# Patient Record
Sex: Male | Born: 1975 | Race: Black or African American | Hispanic: No | Marital: Single | Smoking: Never smoker
Health system: Southern US, Community
[De-identification: ages and names within clinical notes are randomized; demographics above are authoritative.]

## PROBLEM LIST (undated history)

## (undated) DIAGNOSIS — K449 Diaphragmatic hernia without obstruction or gangrene: Secondary | ICD-10-CM

## (undated) DIAGNOSIS — N419 Inflammatory disease of prostate, unspecified: Secondary | ICD-10-CM

## (undated) DIAGNOSIS — M722 Plantar fascial fibromatosis: Secondary | ICD-10-CM

## (undated) DIAGNOSIS — R000 Tachycardia, unspecified: Secondary | ICD-10-CM

## (undated) DIAGNOSIS — I10 Essential (primary) hypertension: Secondary | ICD-10-CM

## (undated) DIAGNOSIS — R002 Palpitations: Secondary | ICD-10-CM

## (undated) HISTORY — DX: Inflammatory disease of prostate, unspecified: N41.9

## (undated) HISTORY — DX: Palpitations: R00.2

## (undated) HISTORY — DX: Tachycardia, unspecified: R00.0

## (undated) HISTORY — DX: Diaphragmatic hernia without obstruction or gangrene: K44.9

## (undated) HISTORY — DX: Plantar fascial fibromatosis: M72.2

---
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
## 1997-07-10 ENCOUNTER — Emergency Department (HOSPITAL_COMMUNITY): Admission: EM | Admit: 1997-07-10 | Discharge: 1997-07-10 | Payer: Self-pay | Admitting: Emergency Medicine

## 1997-07-23 ENCOUNTER — Emergency Department (HOSPITAL_COMMUNITY): Admission: EM | Admit: 1997-07-23 | Discharge: 1997-07-24 | Payer: Self-pay | Admitting: Emergency Medicine

## 1997-11-16 ENCOUNTER — Emergency Department (HOSPITAL_COMMUNITY): Admission: EM | Admit: 1997-11-16 | Discharge: 1997-11-16 | Payer: Self-pay | Admitting: Emergency Medicine

## 1997-12-29 ENCOUNTER — Emergency Department (HOSPITAL_COMMUNITY): Admission: EM | Admit: 1997-12-29 | Discharge: 1997-12-29 | Payer: Self-pay | Admitting: Emergency Medicine

## 1998-03-13 ENCOUNTER — Emergency Department (HOSPITAL_COMMUNITY): Admission: EM | Admit: 1998-03-13 | Discharge: 1998-03-13 | Payer: Self-pay | Admitting: Emergency Medicine

## 1998-03-26 ENCOUNTER — Emergency Department (HOSPITAL_COMMUNITY): Admission: EM | Admit: 1998-03-26 | Discharge: 1998-03-26 | Payer: Self-pay | Admitting: Emergency Medicine

## 1998-04-02 ENCOUNTER — Emergency Department (HOSPITAL_COMMUNITY): Admission: EM | Admit: 1998-04-02 | Discharge: 1998-04-02 | Payer: Self-pay | Admitting: Emergency Medicine

## 1998-04-20 ENCOUNTER — Encounter: Payer: Self-pay | Admitting: Emergency Medicine

## 1998-04-20 ENCOUNTER — Emergency Department (HOSPITAL_COMMUNITY): Admission: EM | Admit: 1998-04-20 | Discharge: 1998-04-20 | Payer: Self-pay | Admitting: Emergency Medicine

## 1999-04-09 ENCOUNTER — Emergency Department (HOSPITAL_COMMUNITY): Admission: EM | Admit: 1999-04-09 | Discharge: 1999-04-09 | Payer: Self-pay | Admitting: Emergency Medicine

## 1999-09-22 ENCOUNTER — Emergency Department (HOSPITAL_COMMUNITY): Admission: EM | Admit: 1999-09-22 | Discharge: 1999-09-22 | Payer: Self-pay | Admitting: Emergency Medicine

## 1999-09-23 ENCOUNTER — Encounter: Payer: Self-pay | Admitting: Emergency Medicine

## 2000-11-27 ENCOUNTER — Emergency Department (HOSPITAL_COMMUNITY): Admission: EM | Admit: 2000-11-27 | Discharge: 2000-11-27 | Payer: Self-pay

## 2001-07-09 ENCOUNTER — Emergency Department (HOSPITAL_COMMUNITY): Admission: EM | Admit: 2001-07-09 | Discharge: 2001-07-09 | Payer: Self-pay | Admitting: Emergency Medicine

## 2001-07-09 ENCOUNTER — Encounter: Payer: Self-pay | Admitting: Emergency Medicine

## 2003-03-11 ENCOUNTER — Emergency Department (HOSPITAL_COMMUNITY): Admission: EM | Admit: 2003-03-11 | Discharge: 2003-03-11 | Payer: Self-pay | Admitting: Emergency Medicine

## 2005-03-14 ENCOUNTER — Emergency Department (HOSPITAL_COMMUNITY): Admission: EM | Admit: 2005-03-14 | Discharge: 2005-03-15 | Payer: Self-pay | Admitting: *Deleted

## 2006-01-16 ENCOUNTER — Ambulatory Visit: Payer: Self-pay | Admitting: Family Medicine

## 2006-01-16 ENCOUNTER — Emergency Department (HOSPITAL_COMMUNITY): Admission: EM | Admit: 2006-01-16 | Discharge: 2006-01-16 | Payer: Self-pay | Admitting: Emergency Medicine

## 2006-07-25 ENCOUNTER — Emergency Department (HOSPITAL_COMMUNITY): Admission: EM | Admit: 2006-07-25 | Discharge: 2006-07-25 | Payer: Self-pay | Admitting: Emergency Medicine

## 2007-04-24 ENCOUNTER — Emergency Department (HOSPITAL_COMMUNITY): Admission: EM | Admit: 2007-04-24 | Discharge: 2007-04-24 | Payer: Self-pay | Admitting: Family Medicine

## 2007-05-31 ENCOUNTER — Emergency Department (HOSPITAL_COMMUNITY): Admission: EM | Admit: 2007-05-31 | Discharge: 2007-05-31 | Payer: Self-pay | Admitting: Family Medicine

## 2007-06-30 ENCOUNTER — Emergency Department (HOSPITAL_COMMUNITY): Admission: EM | Admit: 2007-06-30 | Discharge: 2007-06-30 | Payer: Self-pay | Admitting: Emergency Medicine

## 2007-08-07 ENCOUNTER — Emergency Department (HOSPITAL_COMMUNITY): Admission: EM | Admit: 2007-08-07 | Discharge: 2007-08-07 | Payer: Self-pay | Admitting: Emergency Medicine

## 2007-08-10 ENCOUNTER — Ambulatory Visit: Payer: Self-pay | Admitting: Family Medicine

## 2007-08-14 ENCOUNTER — Ambulatory Visit: Payer: Self-pay | Admitting: Family Medicine

## 2007-08-15 ENCOUNTER — Encounter: Admission: RE | Admit: 2007-08-15 | Discharge: 2007-08-15 | Payer: Self-pay | Admitting: Family Medicine

## 2007-09-27 ENCOUNTER — Ambulatory Visit: Payer: Self-pay | Admitting: Family Medicine

## 2007-11-24 ENCOUNTER — Emergency Department (HOSPITAL_COMMUNITY): Admission: EM | Admit: 2007-11-24 | Discharge: 2007-11-24 | Payer: Self-pay | Admitting: Emergency Medicine

## 2007-12-14 ENCOUNTER — Ambulatory Visit: Payer: Self-pay | Admitting: Family Medicine

## 2008-05-24 ENCOUNTER — Emergency Department (HOSPITAL_COMMUNITY): Admission: EM | Admit: 2008-05-24 | Discharge: 2008-05-25 | Payer: Self-pay | Admitting: Emergency Medicine

## 2008-05-27 ENCOUNTER — Ambulatory Visit: Payer: Self-pay | Admitting: Family Medicine

## 2008-07-02 ENCOUNTER — Emergency Department (HOSPITAL_COMMUNITY): Admission: EM | Admit: 2008-07-02 | Discharge: 2008-07-02 | Payer: Self-pay | Admitting: Family Medicine

## 2008-09-05 ENCOUNTER — Emergency Department (HOSPITAL_COMMUNITY): Admission: EM | Admit: 2008-09-05 | Discharge: 2008-09-05 | Payer: Self-pay | Admitting: Emergency Medicine

## 2008-09-19 ENCOUNTER — Ambulatory Visit: Payer: Self-pay | Admitting: Family Medicine

## 2009-09-06 ENCOUNTER — Emergency Department (HOSPITAL_COMMUNITY): Admission: EM | Admit: 2009-09-06 | Discharge: 2009-09-06 | Payer: Self-pay | Admitting: Emergency Medicine

## 2009-09-16 ENCOUNTER — Ambulatory Visit: Payer: Self-pay | Admitting: Family Medicine

## 2009-11-17 ENCOUNTER — Ambulatory Visit: Payer: Self-pay | Admitting: Cardiovascular Disease

## 2009-11-21 ENCOUNTER — Emergency Department (HOSPITAL_COMMUNITY): Admission: EM | Admit: 2009-11-21 | Discharge: 2009-11-21 | Payer: Self-pay | Admitting: Emergency Medicine

## 2010-02-12 IMAGING — CT CT HEAD W/O CM
1 series · 16 of 28 positions shown, 20 images · non-contrast
Comparison: None.

CLINICAL DATA: Hypertension.  Weakness.  Left hand parasthesias.

CT HEAD WITHOUT CONTRAST
TECHNIQUE: Contiguous axial images were obtained from the base of
the skull through the vertex without contrast.

[Series 2: brain · axial · 0.45mm/px · z∈[+151,+281]mm · 16 of 28 slices shown, 20 images]
[im 2/28  brain]
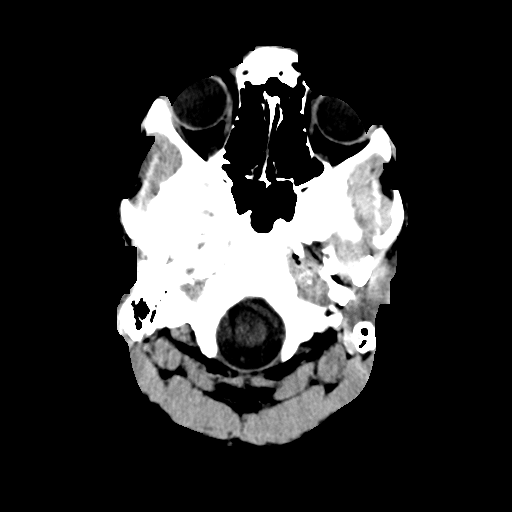
[im 2/28  bone]
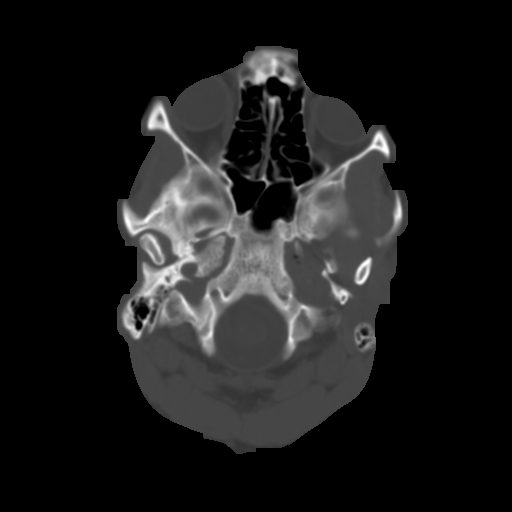
[im 4/28  brain]
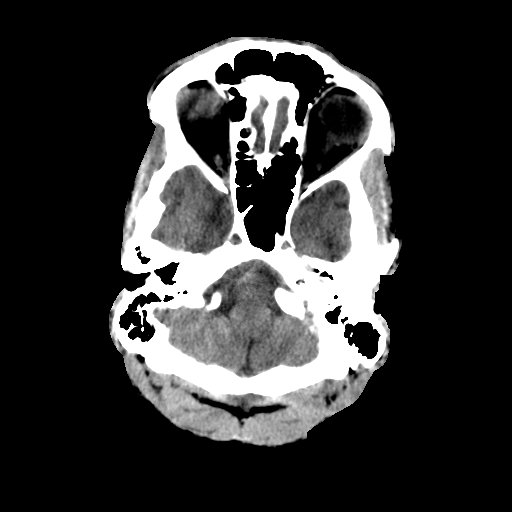
[im 6/28  brain]
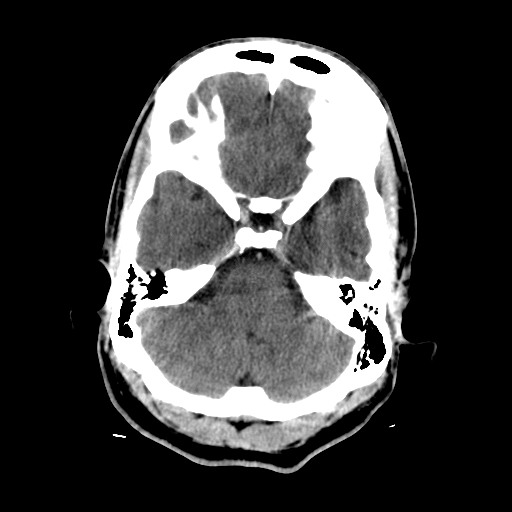
[im 7/28  brain]
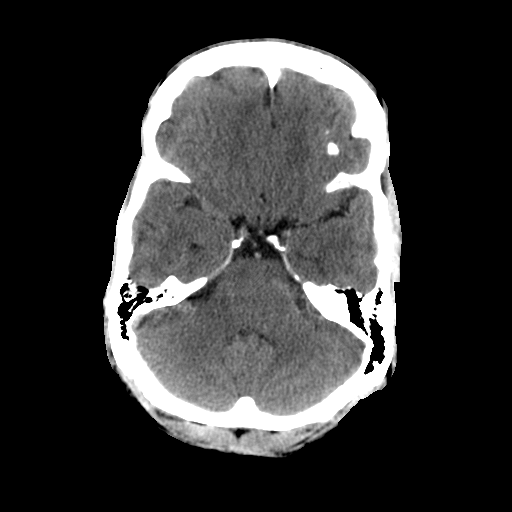
[im 9/28  brain]
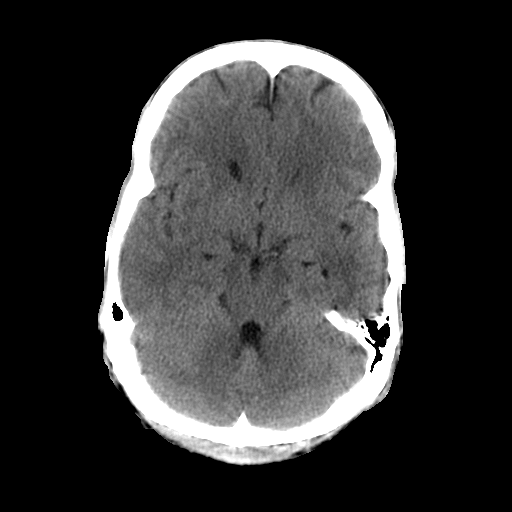
[im 9/28  bone]
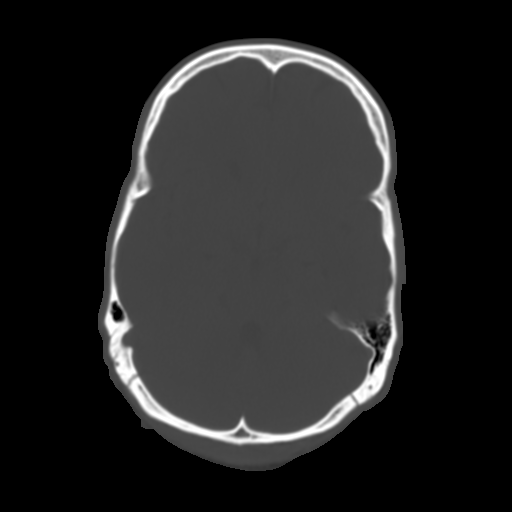
[im 10/28  brain]
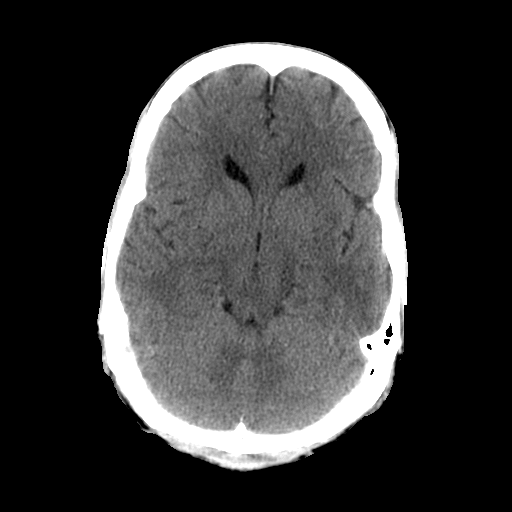
[im 12/28  brain]
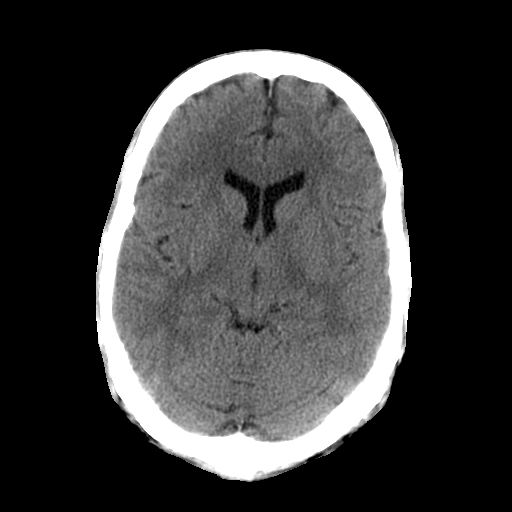
[im 14/28  brain]
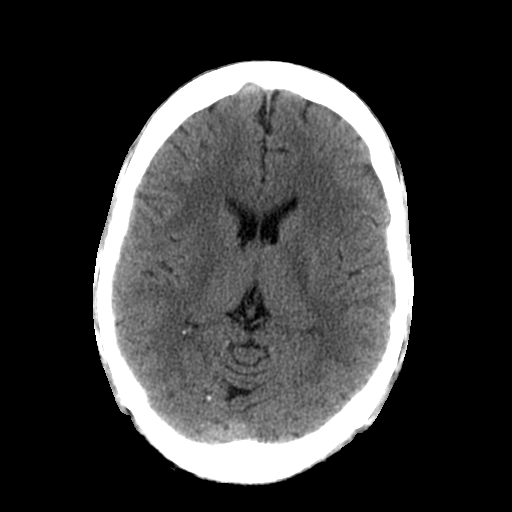
[im 15/28  brain]
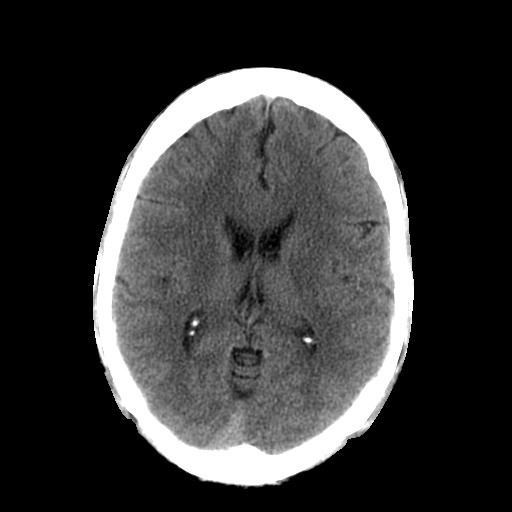
[im 15/28  bone]
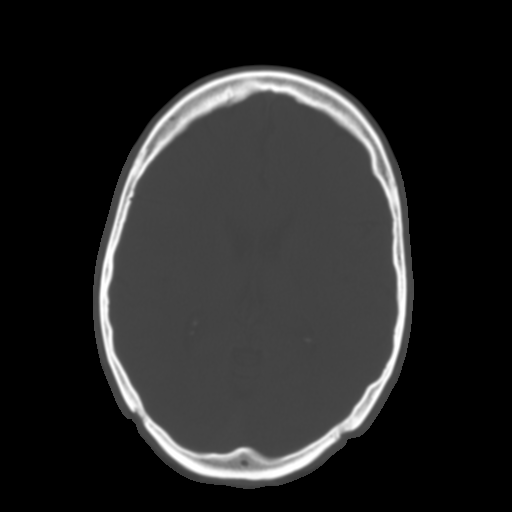
[im 17/28  brain]
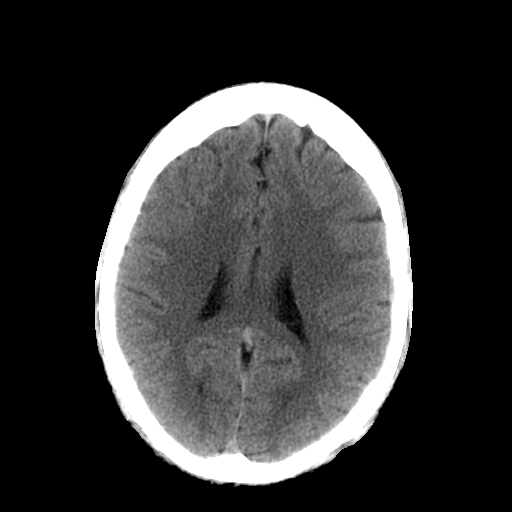
[im 19/28  brain]
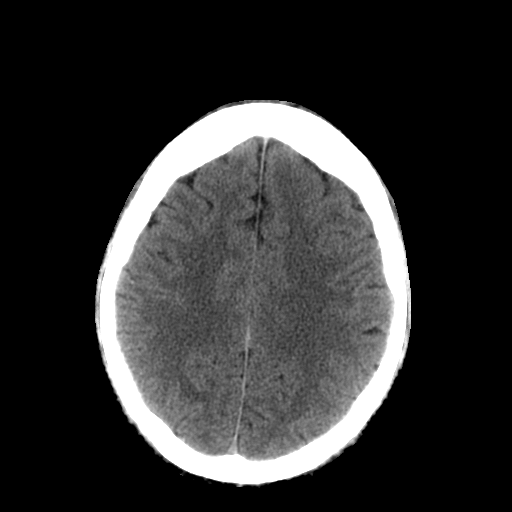
[im 20/28  brain]
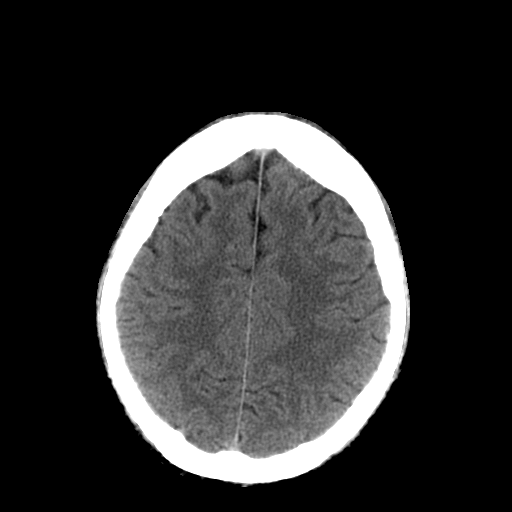
[im 22/28  brain]
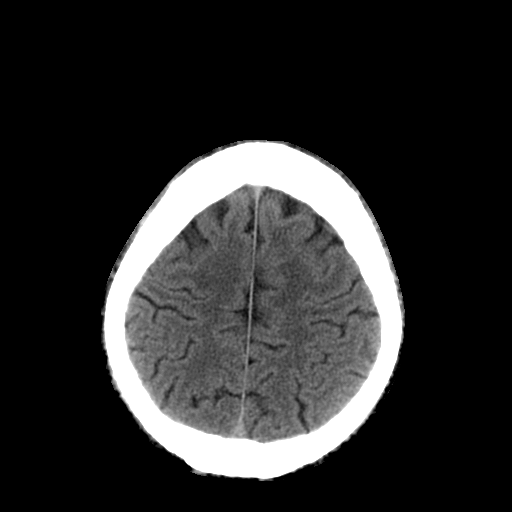
[im 22/28  bone]
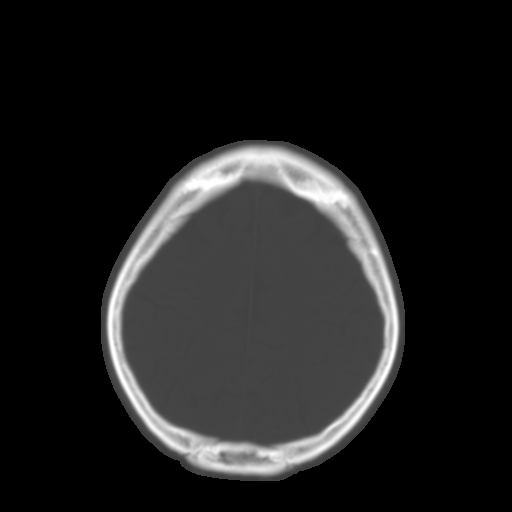
[im 23/28  brain]
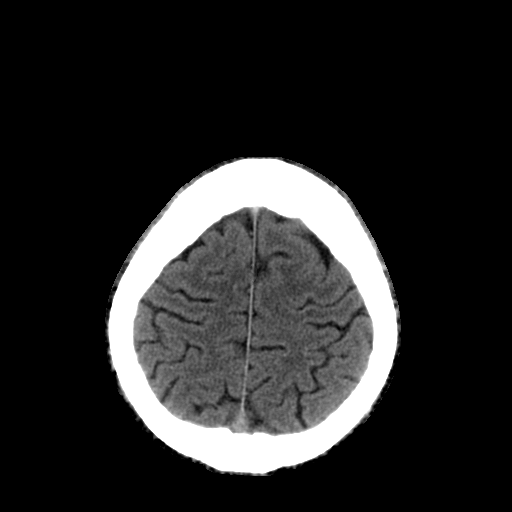
[im 25/28  brain]
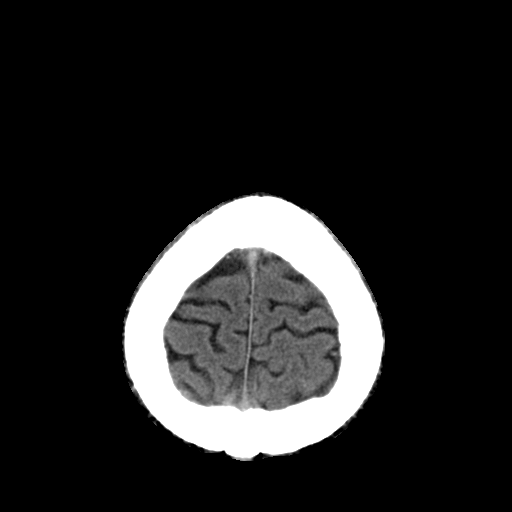
[im 27/28  brain]
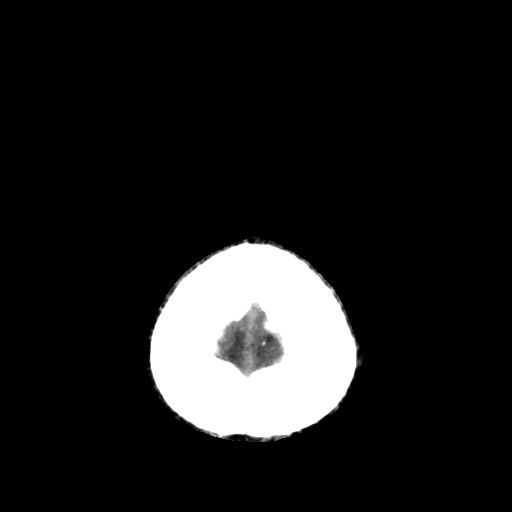

[16 of 28 positions shown; findings below may reference images not displayed]

FINDINGS: No acute intracranial abnormality is present.
Specifically, there is no evidence for acute infarct, hemorrhage,
mass, hydrocephalus, or extra-axial fluid collection.  The
paranasal sinuses and mastoid air cells are clear.  The globes and
orbits are intact.  The osseous skull is intact. Note is made of
moderate surrounding in the external auditory canals bilaterally.
IMPRESSION: 1.  No acute intracranial abnormality.

## 2010-03-27 ENCOUNTER — Emergency Department (HOSPITAL_COMMUNITY): Payer: BC Managed Care – PPO

## 2010-03-27 ENCOUNTER — Emergency Department (HOSPITAL_COMMUNITY)
Admission: EM | Admit: 2010-03-27 | Discharge: 2010-03-27 | Disposition: A | Discharge status: Home / Self Care | Payer: BC Managed Care – PPO | Attending: Emergency Medicine | Admitting: Emergency Medicine

## 2010-03-27 DIAGNOSIS — R0789 Other chest pain: Secondary | ICD-10-CM | POA: Insufficient documentation

## 2010-03-27 DIAGNOSIS — R002 Palpitations: Secondary | ICD-10-CM | POA: Insufficient documentation

## 2010-03-27 DIAGNOSIS — I10 Essential (primary) hypertension: Secondary | ICD-10-CM | POA: Insufficient documentation

## 2010-03-27 LAB — POCT I-STAT, CHEM 8
BUN: 15 mg/dL (ref 6–23)
Calcium, Ion: 1.09 mmol/L — ABNORMAL LOW (ref 1.12–1.32)
Chloride: 100 mEq/L (ref 96–112)
Creatinine, Ser: 1.2 mg/dL (ref 0.4–1.5)
Glucose, Bld: 121 mg/dL — ABNORMAL HIGH (ref 70–99)
HCT: 53 % — ABNORMAL HIGH (ref 39.0–52.0)
Hemoglobin: 18 g/dL — ABNORMAL HIGH (ref 13.0–17.0)
Potassium: 3.7 mEq/L (ref 3.5–5.1)
Sodium: 137 mEq/L (ref 135–145)
TCO2: 26 mmol/L (ref 0–100)

## 2010-03-30 IMAGING — US US ABDOMEN COMPLETE
1 series · 14 of 25 positions shown · non-contrast
Comparison: None

CLINICAL DATA: Hiatal hernia and epigastric pain

ABDOMEN ULTRASOUND
TECHNIQUE: Complete abdominal ultrasound examination was performed
including evaluation of the liver, gallbladder, bile ducts,
pancreas, kidneys, spleen, IVC, and abdominal aorta.

[Series 1: us abdomen complete · 0.26mm/px · 14 of 81 slices shown]
[im 1/81]
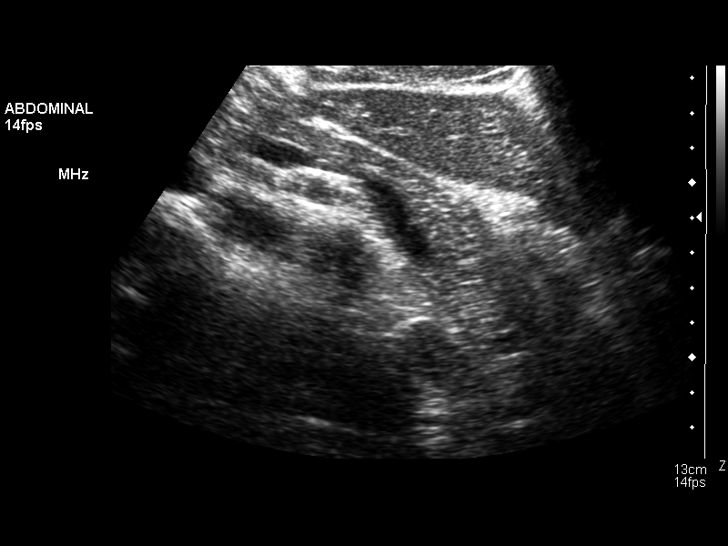
[im 7/81]
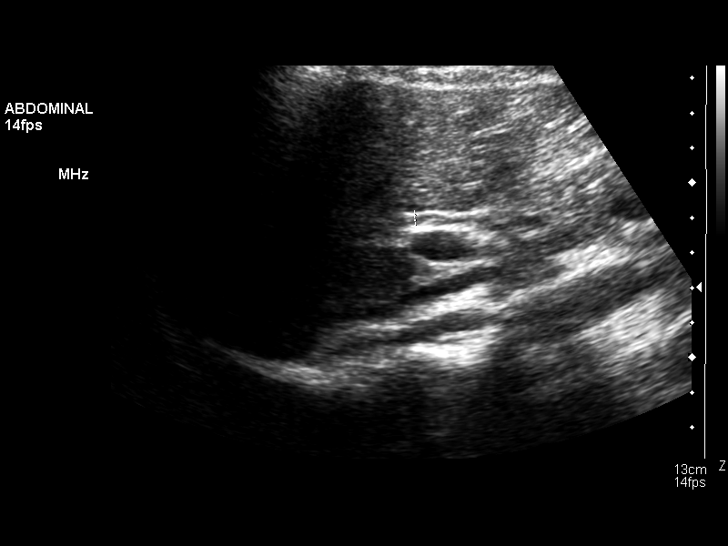
[im 14/81]
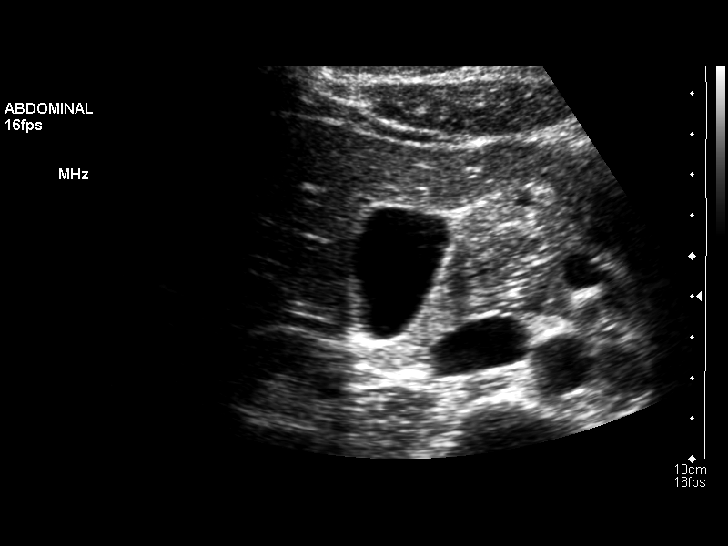
[im 21/81]
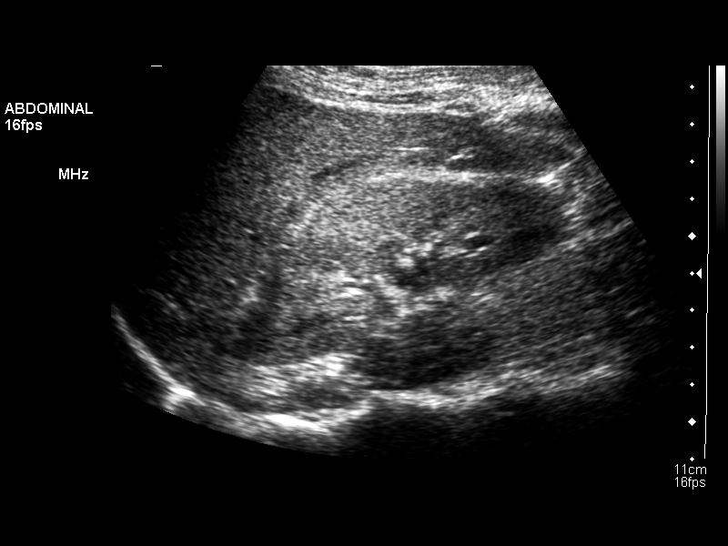
[im 27/81]
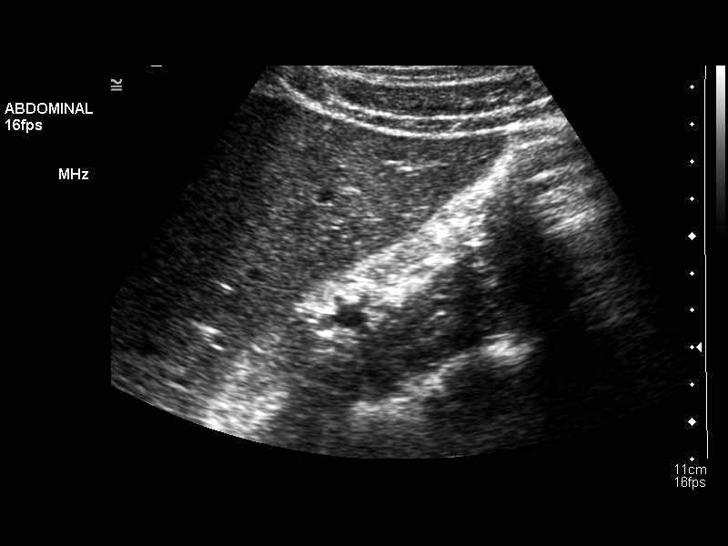
[im 31/81]
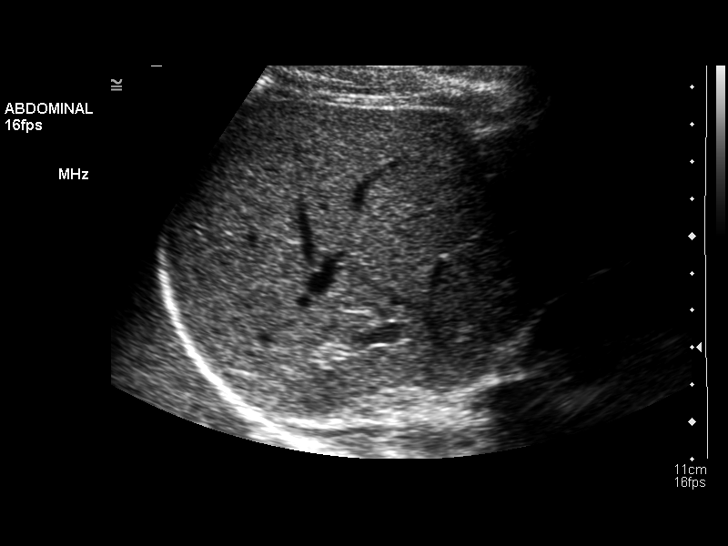
[im 37/81]
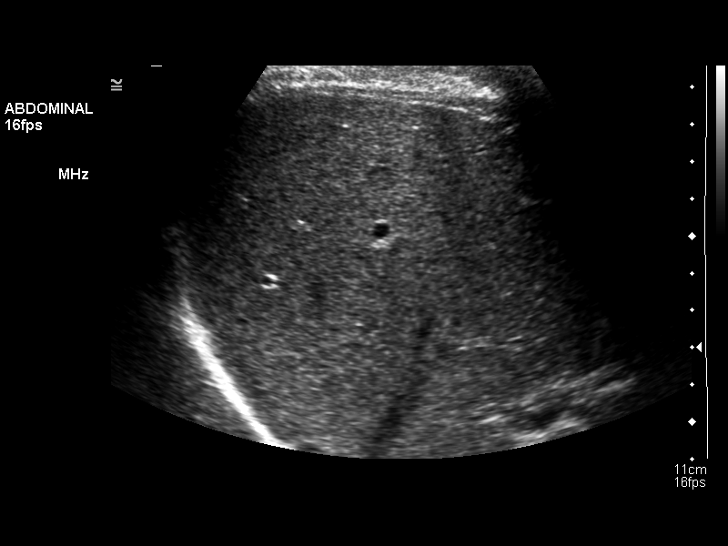
[im 44/81]
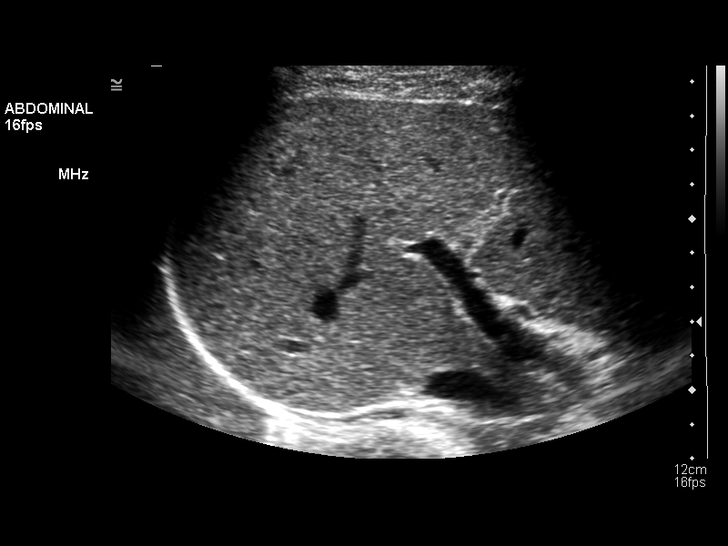
[im 51/81]
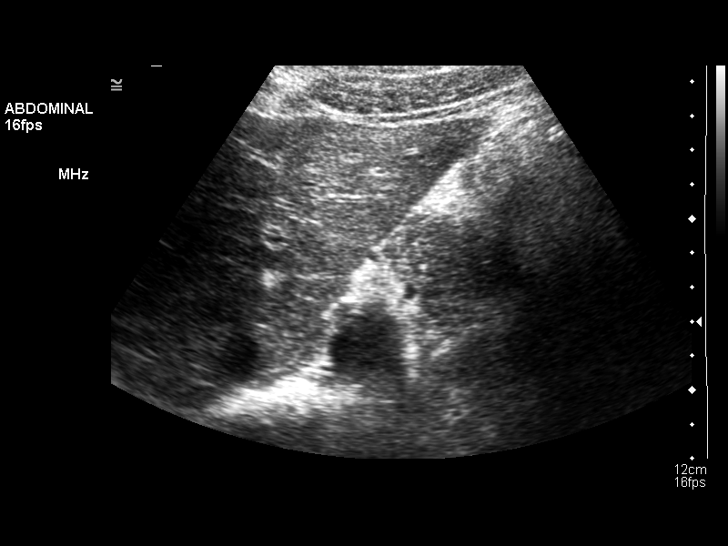
[im 54/81]
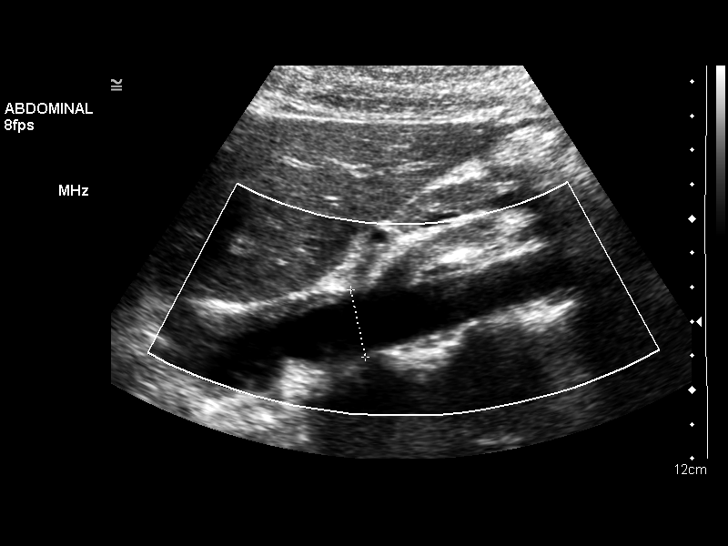
[im 61/81]
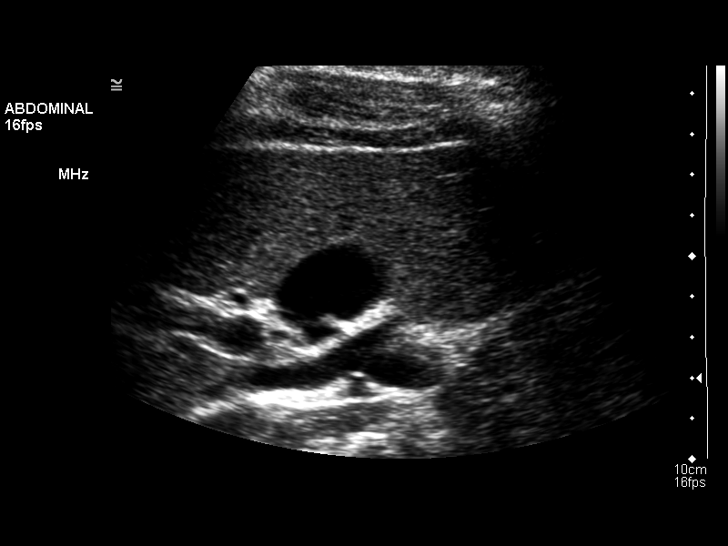
[im 67/81]
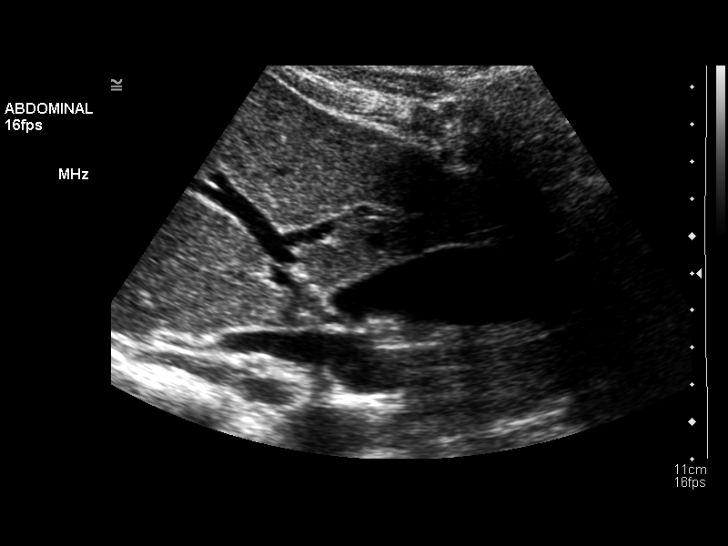
[im 74/81]
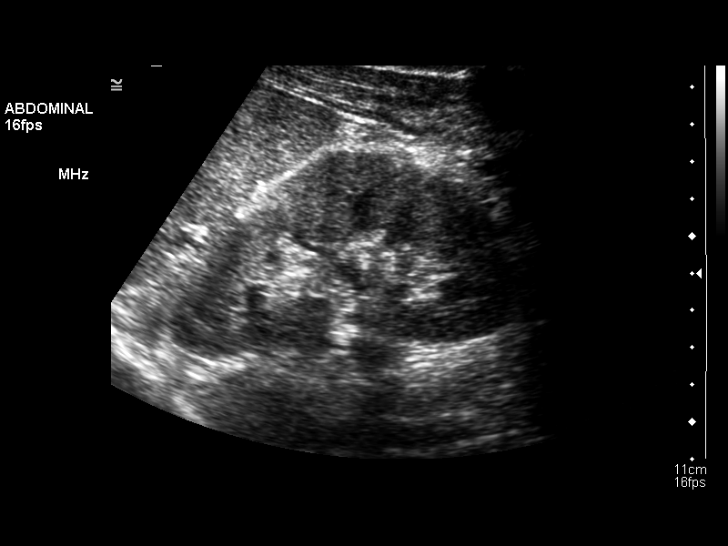
[im 81/81]
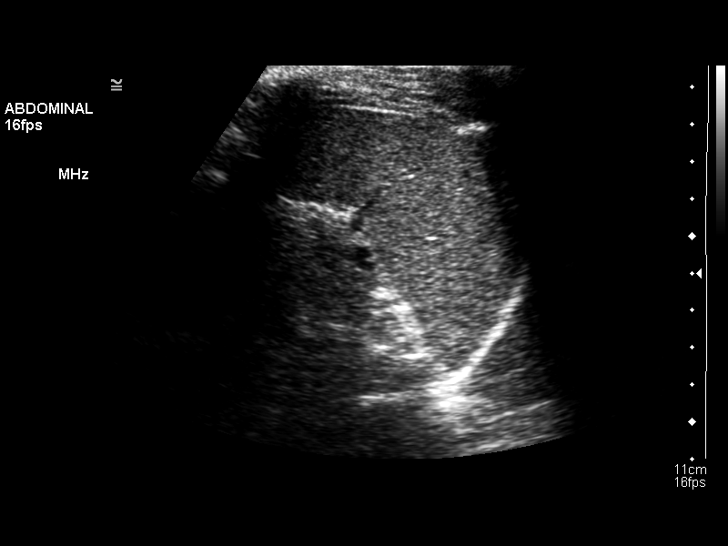

[14 of 25 positions shown; findings below may reference images not displayed]

FINDINGS: The gallbladder is normal.  No stones, wall thickening or
pericholecystic fluid.  Negative sonographic Murphy's sign.

The common bile duct is normal in caliber

The liver is negative.

IVC is patent

Pancreas negative.

Spleen is normal and measures 7.9 cm.

The right kidney is mildly echogenic but is otherwise normal in
morphology measuring 10.6 cm.

The left kidney is normal measuring 10.4 cm.

The abdominal aorta is normal in AP dimension.
IMPRESSION: 1.  No acute findings.
2.  Normal gallbladder.

## 2010-04-14 ENCOUNTER — Ambulatory Visit (INDEPENDENT_AMBULATORY_CARE_PROVIDER_SITE_OTHER): Payer: BC Managed Care – PPO | Admitting: Cardiovascular Disease

## 2010-04-14 DIAGNOSIS — I119 Hypertensive heart disease without heart failure: Secondary | ICD-10-CM

## 2010-04-19 ENCOUNTER — Ambulatory Visit (INDEPENDENT_AMBULATORY_CARE_PROVIDER_SITE_OTHER): Payer: BC Managed Care – PPO | Admitting: Family Medicine

## 2010-04-19 DIAGNOSIS — N410 Acute prostatitis: Secondary | ICD-10-CM

## 2010-04-25 ENCOUNTER — Emergency Department (HOSPITAL_COMMUNITY)
Admission: EM | Admit: 2010-04-25 | Discharge: 2010-04-25 | Disposition: A | Discharge status: Home / Self Care | Payer: BC Managed Care – PPO | Attending: Emergency Medicine | Admitting: Emergency Medicine

## 2010-04-25 DIAGNOSIS — M2550 Pain in unspecified joint: Secondary | ICD-10-CM | POA: Insufficient documentation

## 2010-04-25 DIAGNOSIS — E876 Hypokalemia: Secondary | ICD-10-CM | POA: Insufficient documentation

## 2010-04-25 DIAGNOSIS — I10 Essential (primary) hypertension: Secondary | ICD-10-CM | POA: Insufficient documentation

## 2010-04-25 DIAGNOSIS — Z79899 Other long term (current) drug therapy: Secondary | ICD-10-CM | POA: Insufficient documentation

## 2010-04-25 LAB — URINALYSIS, ROUTINE W REFLEX MICROSCOPIC
Glucose, UA: NEGATIVE mg/dL
Nitrite: NEGATIVE
Specific Gravity, Urine: 1.013 (ref 1.005–1.030)
pH: 6.5 (ref 5.0–8.0)

## 2010-04-25 LAB — POCT I-STAT, CHEM 8
BUN: 13 mg/dL (ref 6–23)
Calcium, Ion: 1.11 mmol/L — ABNORMAL LOW (ref 1.12–1.32)
Chloride: 104 mEq/L (ref 96–112)
Glucose, Bld: 132 mg/dL — ABNORMAL HIGH (ref 70–99)
HCT: 51 % (ref 39.0–52.0)
Potassium: 3.2 mEq/L — ABNORMAL LOW (ref 3.5–5.1)

## 2010-05-05 LAB — URINE CULTURE
Colony Count: NO GROWTH
Culture  Setup Time: 201110011736

## 2010-05-05 LAB — POCT I-STAT, CHEM 8
BUN: 9 mg/dL (ref 6–23)
Calcium, Ion: 1.15 mmol/L (ref 1.12–1.32)
Chloride: 97 mEq/L (ref 96–112)
Creatinine, Ser: 1.2 mg/dL (ref 0.4–1.5)
Glucose, Bld: 111 mg/dL — ABNORMAL HIGH (ref 70–99)
TCO2: 30 mmol/L (ref 0–100)

## 2010-05-05 LAB — URINALYSIS, ROUTINE W REFLEX MICROSCOPIC
Bilirubin Urine: NEGATIVE
Glucose, UA: NEGATIVE mg/dL
Hgb urine dipstick: NEGATIVE
Ketones, ur: NEGATIVE mg/dL
Protein, ur: NEGATIVE mg/dL
pH: 7 (ref 5.0–8.0)

## 2010-05-08 LAB — URINALYSIS, ROUTINE W REFLEX MICROSCOPIC
Bilirubin Urine: NEGATIVE
Glucose, UA: NEGATIVE mg/dL
Hgb urine dipstick: NEGATIVE
Ketones, ur: NEGATIVE mg/dL
pH: 6 (ref 5.0–8.0)

## 2010-05-28 ENCOUNTER — Other Ambulatory Visit: Payer: BC Managed Care – PPO | Admitting: *Deleted

## 2010-06-02 ENCOUNTER — Other Ambulatory Visit: Payer: Self-pay | Admitting: *Deleted

## 2010-06-02 DIAGNOSIS — Z79899 Other long term (current) drug therapy: Secondary | ICD-10-CM

## 2010-06-02 LAB — GLUCOSE, CAPILLARY

## 2010-06-04 ENCOUNTER — Other Ambulatory Visit (INDEPENDENT_AMBULATORY_CARE_PROVIDER_SITE_OTHER): Payer: BC Managed Care – PPO | Admitting: *Deleted

## 2010-06-04 DIAGNOSIS — Z79899 Other long term (current) drug therapy: Secondary | ICD-10-CM

## 2010-06-04 LAB — HEPATIC FUNCTION PANEL
AST: 37 U/L (ref 0–37)
Alkaline Phosphatase: 86 U/L (ref 39–117)
Bilirubin, Direct: 0.1 mg/dL (ref 0.0–0.3)
Total Protein: 7.1 g/dL (ref 6.0–8.3)

## 2010-06-04 LAB — BASIC METABOLIC PANEL
CO2: 31 mEq/L (ref 19–32)
Calcium: 10.1 mg/dL (ref 8.4–10.5)
Potassium: 4.2 mEq/L (ref 3.5–5.1)
Sodium: 138 mEq/L (ref 135–145)

## 2010-06-07 ENCOUNTER — Telehealth: Payer: Self-pay | Admitting: *Deleted

## 2010-06-07 NOTE — Telephone Encounter (Signed)
Patient called with lab results. Pt verbalized understanding. Jodette Shalom Ware RN  

## 2010-06-24 ENCOUNTER — Telehealth: Payer: Self-pay | Admitting: *Deleted

## 2010-06-24 NOTE — Telephone Encounter (Signed)
Pt calling wanting to know if it is ok to take an OTC prostate supplement.  L/M for pt to contact PCP regarding the supplement or he can call his Urologist.

## 2010-08-14 ENCOUNTER — Emergency Department (HOSPITAL_COMMUNITY)
Admission: EM | Admit: 2010-08-14 | Discharge: 2010-08-14 | Disposition: A | Discharge status: Home / Self Care | Payer: BC Managed Care – PPO | Attending: Emergency Medicine | Admitting: Emergency Medicine

## 2010-08-14 ENCOUNTER — Other Ambulatory Visit: Payer: Self-pay | Admitting: Cardiovascular Disease

## 2010-08-14 DIAGNOSIS — Z760 Encounter for issue of repeat prescription: Secondary | ICD-10-CM | POA: Insufficient documentation

## 2010-08-14 DIAGNOSIS — I10 Essential (primary) hypertension: Secondary | ICD-10-CM | POA: Insufficient documentation

## 2010-08-14 DIAGNOSIS — Z79899 Other long term (current) drug therapy: Secondary | ICD-10-CM | POA: Insufficient documentation

## 2010-08-16 ENCOUNTER — Other Ambulatory Visit: Payer: Self-pay | Admitting: *Deleted

## 2010-08-16 MED ORDER — METOPROLOL TARTRATE 50 MG PO TABS: 50.0000 mg | ORAL_TABLET | Freq: Two times a day (BID) | ORAL | Status: DC

## 2010-08-16 NOTE — Telephone Encounter (Signed)
Fax received from pharmacy. Refill completed. Jodette Henretta Quist RN  

## 2010-08-19 ENCOUNTER — Telehealth: Payer: Self-pay | Admitting: Cardiovascular Disease

## 2010-08-19 NOTE — Telephone Encounter (Signed)
Was refilled on 08/16/10 pt informed.

## 2010-08-19 NOTE — Telephone Encounter (Signed)
Wants a refill on his metoprolol sent to [facility]

## 2010-09-30 ENCOUNTER — Encounter: Payer: Self-pay | Admitting: Family Medicine

## 2010-11-16 LAB — POCT URINALYSIS DIP (DEVICE)
Bilirubin Urine: NEGATIVE
Hgb urine dipstick: NEGATIVE
Ketones, ur: NEGATIVE
pH: 7.5

## 2010-11-18 LAB — POCT URINALYSIS DIP (DEVICE)
Bilirubin Urine: NEGATIVE
Hgb urine dipstick: NEGATIVE
Ketones, ur: NEGATIVE
pH: 7

## 2010-11-18 LAB — POCT I-STAT, CHEM 8
BUN: 12
Calcium, Ion: 1.17
Chloride: 100
Creatinine, Ser: 1.1
Glucose, Bld: 102 — ABNORMAL HIGH

## 2010-11-22 LAB — URINE CULTURE
Colony Count: NO GROWTH
Culture: NO GROWTH

## 2010-11-22 LAB — POCT URINALYSIS DIP (DEVICE)
Glucose, UA: NEGATIVE
Hgb urine dipstick: NEGATIVE
Nitrite: NEGATIVE
Operator id: 247071
Urobilinogen, UA: 0.2

## 2010-11-22 LAB — GLUCOSE, CAPILLARY: Glucose-Capillary: 79

## 2010-11-27 ENCOUNTER — Emergency Department (HOSPITAL_COMMUNITY)
Admission: EM | Admit: 2010-11-27 | Discharge: 2010-11-27 | Disposition: A | Discharge status: Home / Self Care | Payer: BC Managed Care – PPO | Attending: Emergency Medicine | Admitting: Emergency Medicine

## 2010-11-27 DIAGNOSIS — I10 Essential (primary) hypertension: Secondary | ICD-10-CM | POA: Insufficient documentation

## 2010-11-27 DIAGNOSIS — M25549 Pain in joints of unspecified hand: Secondary | ICD-10-CM | POA: Insufficient documentation

## 2010-11-27 DIAGNOSIS — E876 Hypokalemia: Secondary | ICD-10-CM | POA: Insufficient documentation

## 2010-11-27 DIAGNOSIS — E860 Dehydration: Secondary | ICD-10-CM | POA: Insufficient documentation

## 2010-11-27 LAB — URINALYSIS, ROUTINE W REFLEX MICROSCOPIC
Hgb urine dipstick: NEGATIVE
Ketones, ur: 15 mg/dL — AB
Protein, ur: NEGATIVE mg/dL
Urobilinogen, UA: 0.2 mg/dL (ref 0.0–1.0)

## 2010-11-27 LAB — POCT I-STAT, CHEM 8
Creatinine, Ser: 1.2 mg/dL (ref 0.50–1.35)
Hemoglobin: 17.3 g/dL — ABNORMAL HIGH (ref 13.0–17.0)
Sodium: 139 mEq/L (ref 135–145)
TCO2: 25 mmol/L (ref 0–100)

## 2010-11-28 LAB — URINE CULTURE

## 2010-11-29 ENCOUNTER — Other Ambulatory Visit: Payer: Self-pay | Admitting: Cardiovascular Disease

## 2010-12-03 ENCOUNTER — Encounter: Payer: Self-pay | Admitting: Family Medicine

## 2010-12-03 ENCOUNTER — Ambulatory Visit (INDEPENDENT_AMBULATORY_CARE_PROVIDER_SITE_OTHER): Payer: BC Managed Care – PPO | Admitting: Family Medicine

## 2010-12-03 VITALS — BP 140/88 | HR 93 | Wt 182.0 lb

## 2010-12-03 DIAGNOSIS — N419 Inflammatory disease of prostate, unspecified: Secondary | ICD-10-CM

## 2010-12-03 DIAGNOSIS — E876 Hypokalemia: Secondary | ICD-10-CM

## 2010-12-03 DIAGNOSIS — I10 Essential (primary) hypertension: Secondary | ICD-10-CM

## 2010-12-03 LAB — ELECTROLYTE PANEL
Chloride: 102 mEq/L (ref 96–112)
Potassium: 4.3 mEq/L (ref 3.5–5.3)
Sodium: 138 mEq/L (ref 135–145)

## 2010-12-03 MED ORDER — CIPROFLOXACIN HCL 500 MG PO TABS: 500.0000 mg | ORAL_TABLET | Freq: Two times a day (BID) | ORAL | Status: AC

## 2010-12-03 NOTE — Progress Notes (Signed)
  Subjective:    Patient ID: Christopher Allison, male    DOB: 01-16-1976, 35 y.o.   MRN: [id number]  HPI Approximately 5 days ago he started having difficulty with polyuria then developed some back and perineal pain. He did go to the emergency room on Saturday for symptoms of stiffness in his joints prior to developing the urinary symptoms but did have his urine checked and it was apparently negative. He has a previous history of this which turned out to be hypokalemic. His potassium was 3.1 and they gave him potassium supplementation.    Review of Systems     Objective:   Physical Exam Alert and in no distress. Rectal exam does show a tender boggy prostate.       Assessment & Plan:   1. Hypertension    2. Hypokalemia  Electrolyte panel  3. Prostatitis     I will place him on Cipro for the next month. He is also to continue on potassium supplementation.

## 2010-12-03 NOTE — Patient Instructions (Signed)
Take all the antibiotic and if not back to normal, let me know. You can also try No Salt that you can get at the grocery store continue on the potassium supplement pills.

## 2010-12-17 ENCOUNTER — Other Ambulatory Visit: Payer: Self-pay | Admitting: Cardiovascular Disease

## 2010-12-22 ENCOUNTER — Telehealth: Payer: Self-pay | Admitting: Cardiovascular Disease

## 2010-12-22 NOTE — Telephone Encounter (Signed)
New message  Pt said he took rx to pharmacy on Friday  Pharmacy [facility] [address]  He wants refill of hydrochlorothiazide  Please call when done

## 2011-02-20 ENCOUNTER — Other Ambulatory Visit: Payer: Self-pay | Admitting: Cardiovascular Disease

## 2011-02-25 ENCOUNTER — Other Ambulatory Visit: Payer: Self-pay | Admitting: Cardiovascular Disease

## 2011-02-25 MED ORDER — METOPROLOL TARTRATE 50 MG PO TABS: 50.0000 mg | ORAL_TABLET | Freq: Two times a day (BID) | ORAL | Status: DC

## 2011-02-25 NOTE — Telephone Encounter (Signed)
Has a recall sent app due 2/13

## 2011-02-25 NOTE — Telephone Encounter (Signed)
Pt needs appointment then refill can be made, Fax Received. Refill Completed. Christopher Allison (R.M.A)

## 2011-04-25 ENCOUNTER — Telehealth: Payer: Self-pay | Admitting: Internal Medicine

## 2011-04-26 NOTE — Telephone Encounter (Signed)
He complains of difficulty with decreased urinary stream. He also set some back pain but relates this more of physical activity. I recommended he come in for an evaluation.

## 2011-05-05 ENCOUNTER — Encounter (HOSPITAL_COMMUNITY): Payer: Self-pay | Admitting: *Deleted

## 2011-05-05 ENCOUNTER — Emergency Department (HOSPITAL_COMMUNITY)
Admission: EM | Admit: 2011-05-05 | Discharge: 2011-05-05 | Disposition: A | Discharge status: Home / Self Care | Payer: BC Managed Care – PPO | Attending: Emergency Medicine | Admitting: Emergency Medicine

## 2011-05-05 DIAGNOSIS — R000 Tachycardia, unspecified: Secondary | ICD-10-CM | POA: Insufficient documentation

## 2011-05-05 DIAGNOSIS — M549 Dorsalgia, unspecified: Secondary | ICD-10-CM

## 2011-05-05 DIAGNOSIS — I10 Essential (primary) hypertension: Secondary | ICD-10-CM | POA: Insufficient documentation

## 2011-05-05 DIAGNOSIS — M545 Low back pain, unspecified: Secondary | ICD-10-CM | POA: Insufficient documentation

## 2011-05-05 DIAGNOSIS — K921 Melena: Secondary | ICD-10-CM | POA: Insufficient documentation

## 2011-05-05 HISTORY — DX: Essential (primary) hypertension: I10

## 2011-05-05 LAB — URINALYSIS, ROUTINE W REFLEX MICROSCOPIC
Glucose, UA: NEGATIVE mg/dL
Ketones, ur: NEGATIVE mg/dL
Leukocytes, UA: NEGATIVE
Specific Gravity, Urine: 1.006 (ref 1.005–1.030)
pH: 7 (ref 5.0–8.0)

## 2011-05-05 LAB — POCT I-STAT, CHEM 8
BUN: 15 mg/dL (ref 6–23)
Calcium, Ion: 1.24 mmol/L (ref 1.12–1.32)
Chloride: 101 mEq/L (ref 96–112)

## 2011-05-05 LAB — OCCULT BLOOD, POC DEVICE: Fecal Occult Bld: NEGATIVE

## 2011-05-05 MED ORDER — ACETAMINOPHEN 325 MG PO TABS
975.0000 mg | ORAL_TABLET | Freq: Once | ORAL | Status: AC
  Administered 2011-05-05: 975 mg via ORAL
  Filled 2011-05-05: qty 3

## 2011-05-05 NOTE — ED Provider Notes (Signed)
History     CSN: [id number]  Arrival date & time 05/05/11  1548   First MD Initiated Contact with Patient 05/05/11 1956      Chief Complaint  Patient presents with  . Back Pain    (Consider location/radiation/quality/duration/timing/severity/associated sxs/prior treatment) HPI Complains of low back pain onset 3 days ago after lifting heavy barrels pain is nonradiating mild at present. Dull in quality worse with moving improved with remaining still. No treatment prior to coming here. No other associated symptoms She has also noticed blood on toilet paper for one week when wiping. Healso reports difficulty with urinary stream for 3 years. Unchanged. States dysuria is secondary to "prostate problems" Past Medical History  Diagnosis Date  . HH (hiatus hernia)   . Tachycardia   . Hypertension     History reviewed. No pertinent past surgical history. Hernia repair History reviewed. No pertinent family history.  History  Substance Use Topics  . Smoking status: Never Smoker   . Smokeless tobacco: Never Used  . Alcohol Use: No      Review of Systems  Constitutional: Negative.   HENT: Negative.   Respiratory: Negative.   Cardiovascular: Negative.   Gastrointestinal: Positive for blood in stool.  Genitourinary: Positive for difficulty urinating.  Musculoskeletal: Positive for back pain.  Skin: Negative.   Neurological: Negative.   Hematological: Negative.   Psychiatric/Behavioral: Negative.   All other systems reviewed and are negative.    Allergies  Toprol xl  Home Medications   Current Outpatient Rx  Name Route Sig Dispense Refill  . ACETAMINOPHEN 500 MG PO TABS Oral Take 500 mg by mouth every 6 (six) hours as needed. For pain    . HYDROCHLOROTHIAZIDE 25 MG PO TABS Oral Take 25 mg by mouth daily.    Marland Kitchen METOPROLOL TARTRATE 50 MG PO TABS Oral Take 1 tablet (50 mg total) by mouth 2 (two) times daily. 60 tablet 0    Pt needs appointment then refill can be made  .  POTASSIUM CHLORIDE CRYS ER 10 MEQ PO TBCR Oral Take 10 mEq by mouth daily.      BP 127/75  Pulse 87  Temp(Src) 97.9 F (36.6 C) (Oral)  Resp 16  SpO2 100%  Physical Exam  Constitutional: He is oriented to person, place, and time. He appears well-developed and well-nourished.  HENT:  Head: Normocephalic and atraumatic.  Eyes: Conjunctivae are normal. Pupils are equal, round, and reactive to light.  Neck: Neck supple. No tracheal deviation present. No thyromegaly present.  Cardiovascular: Normal rate and regular rhythm.   No murmur heard. Pulmonary/Chest: Effort normal and breath sounds normal.  Abdominal: Soft. Bowel sounds are normal. He exhibits no distension. There is no tenderness.  Genitourinary: Rectum normal. Guaiac negative stool.  Musculoskeletal: Normal range of motion. He exhibits no edema and no tenderness.       Entire spine nontender  Neurological: He is alert and oriented to person, place, and time. He displays normal reflexes. He exhibits normal muscle tone. Coordination normal.  Skin: Skin is warm and dry. No rash noted.  Psychiatric: He has a normal mood and affect.    ED Course  Procedures (including critical care time)  Labs Reviewed  POCT I-STAT, CHEM 8 - Abnormal; Notable for the following:    Hemoglobin 17.7 (*)    All other components within normal limits  URINALYSIS, ROUTINE W REFLEX MICROSCOPIC   No results found. Results for orders placed during the hospital encounter of 05/05/11  POCT I-STAT,  CHEM 8      Component Value Range   Sodium 140  135 - 145 (mEq/L)   Potassium 3.8  3.5 - 5.1 (mEq/L)   Chloride 101  96 - 112 (mEq/L)   BUN 15  6 - 23 (mg/dL)   Creatinine, Ser 4.54  0.50 - 1.35 (mg/dL)   Glucose, Bld 96  70 - 99 (mg/dL)   Calcium, Ion 0.98  1.19 - 1.32 (mmol/L)   TCO2 28  0 - 100 (mmol/L)   Hemoglobin 17.7 (*) 13.0 - 17.0 (g/dL)   HCT 14.7  82.9 - 56.2 (%)  URINALYSIS, ROUTINE W REFLEX MICROSCOPIC      Component Value Range    Color, Urine YELLOW  YELLOW    APPearance CLEAR  CLEAR    Specific Gravity, Urine 1.006  1.005 - 1.030    pH 7.0  5.0 - 8.0    Glucose, UA NEGATIVE  NEGATIVE (mg/dL)   Hgb urine dipstick NEGATIVE  NEGATIVE    Bilirubin Urine NEGATIVE  NEGATIVE    Ketones, ur NEGATIVE  NEGATIVE (mg/dL)   Protein, ur NEGATIVE  NEGATIVE (mg/dL)   Urobilinogen, UA 0.2  0.0 - 1.0 (mg/dL)   Nitrite NEGATIVE  NEGATIVE    Leukocytes, UA NEGATIVE  NEGATIVE    No results found.   No diagnosis found.  Client pain medicine or prescription for pain medicine once Tylenol only. Tylenol ordered by me  MDM  Back pain felt to be musculoskeletal in etiology. Patient has been followed for "prostate problems He is advised to follow up with Dr.Lalonde for rectal bleeding. May need further diagnostic testing Diagnosis low back pain        Doug Sou, MD 05/05/11 2010

## 2011-05-05 NOTE — ED Notes (Addendum)
Pt reports for the last two weeks he has had lower back pain, reports trouble urinating, pt states he also has noticed blood on the tissue after bowel movement. Reports he only has the lower back pain when he sits down, no pain when standing.

## 2011-05-05 NOTE — Discharge Instructions (Signed)
Back Pain, Adult Back pain is very common. The pain often gets better over time. The cause of back pain is usually not dangerous. Most people can learn to manage their back pain on their own.  HOME CARE   Stay active. Start with short walks on flat ground if you can. Try to walk farther each day.   Do not sit, drive, or stand in one place for more than 30 minutes. Do not stay in bed.   Do not avoid exercise or work. Activity can help your back heal faster.   Be careful when you bend or lift an object. Bend at your knees, keep the object close to you, and do not twist.   Sleep on a firm mattress. Lie on your side, and bend your knees. If you lie on your back, put a pillow under your knees.   Only take medicines as told by your doctor.   Put ice on the injured area.   Put ice in a plastic bag.   Place a towel between your skin and the bag.   Leave the ice on for 15 to 20 minutes, 3 to 4 times a day for the first 2 to 3 days. After that, you can switch between ice and heat packs.   Ask your doctor about back exercises or massage.   Avoid feeling anxious or stressed. Find good ways to deal with stress, such as exercise.  GET HELP RIGHT AWAY IF:   Your pain does not go away with rest or medicine.   Your pain does not go away in 1 week.   You have new problems.   You do not feel well.   The pain spreads into your legs.   You cannot control when you poop (bowel movement) or pee (urinate).   Your arms or legs feel weak or lose feeling (numbness).   You feel sick to your stomach (nauseous) or throw up (vomit).   You have belly (abdominal) pain.   You feel like you may pass out (faint).  MAKE SURE YOU:   Understand these instructions.   Will watch your condition.   Will get help right away if you are not doing well or get worse.  Document Released: 07/27/2007 Document Revised: 01/27/2011 Document Reviewed: 06/28/2010 [facility] Patient Information 2012 [place],  [place].   Take Tylenol as needed for pain. Call Dr.Lalonde tomorrow for followup regarding rectal bleeding. You may need further testing as an outpatient

## 2011-05-11 ENCOUNTER — Encounter: Payer: Self-pay | Admitting: *Deleted

## 2011-07-01 ENCOUNTER — Encounter: Payer: Self-pay | Admitting: Family Medicine

## 2011-07-01 ENCOUNTER — Ambulatory Visit (INDEPENDENT_AMBULATORY_CARE_PROVIDER_SITE_OTHER): Payer: BC Managed Care – PPO | Admitting: Family Medicine

## 2011-07-01 ENCOUNTER — Other Ambulatory Visit: Payer: Self-pay | Admitting: Cardiovascular Disease

## 2011-07-01 VITALS — BP 150/90 | HR 93 | Ht 68.0 in | Wt 149.0 lb

## 2011-07-01 DIAGNOSIS — K602 Anal fissure, unspecified: Secondary | ICD-10-CM

## 2011-07-01 NOTE — Progress Notes (Signed)
  Subjective:    Patient ID: Christopher Allison, male    DOB: 19-Mar-1975, 36 y.o.   MRN: [id number]  HPI He has had intermittent difficulty with rectal discomfort and noting Christopher Allison red blood on the toilet paper after he has had a BM. He has noted no stinging with BMs. Has had no abdominal pain, constipation or diarrhea.   Review of Systems     Objective:   Physical Exam Exam of the rectal area shows no external hemorrhoids. Questionable fissure noted at 12:00. Digital exam did show tenderness at 12:00.       Assessment & Plan:   1. Rectal fissure    I explained rectal fissure to him recommend fluids, bulk in diet and exercise. He will return here if continued difficulty.

## 2011-07-01 NOTE — Patient Instructions (Signed)
Insures softer bowel movements with fluids, bulk in diet and exercise

## 2011-08-03 ENCOUNTER — Other Ambulatory Visit: Payer: Self-pay | Admitting: *Deleted

## 2011-08-03 MED ORDER — METOPROLOL TARTRATE 50 MG PO TABS: 50.0000 mg | ORAL_TABLET | Freq: Two times a day (BID) | ORAL | Status: DC

## 2011-08-03 MED ORDER — POTASSIUM CHLORIDE ER 10 MEQ PO TBCR: 10.0000 meq | EXTENDED_RELEASE_TABLET | Freq: Every day | ORAL | Status: DC

## 2011-08-03 MED ORDER — HYDROCHLOROTHIAZIDE 25 MG PO TABS: 25.0000 mg | ORAL_TABLET | Freq: Every day | ORAL | Status: DC

## 2011-08-03 NOTE — Telephone Encounter (Signed)
Pt needs appointment then refill can be made Fax Received. Refill Completed. [name] (R.M.A)   

## 2011-09-19 ENCOUNTER — Ambulatory Visit (INDEPENDENT_AMBULATORY_CARE_PROVIDER_SITE_OTHER): Payer: BC Managed Care – PPO | Admitting: Cardiovascular Disease

## 2011-09-19 ENCOUNTER — Encounter: Payer: Self-pay | Admitting: Cardiovascular Disease

## 2011-09-19 VITALS — BP 140/90 | HR 91 | Ht 68.0 in | Wt 199.8 lb

## 2011-09-19 DIAGNOSIS — I10 Essential (primary) hypertension: Secondary | ICD-10-CM

## 2011-09-19 NOTE — Progress Notes (Signed)
    [name] Date of Birth  12/20/1975       [facility] Office [address]  [address] [place], [place]  [address]   [place], [place]  [address] [phone]     [phone]   Fax  [phone]    Fax [phone]  Problem List: 1. Tachycardia 2. Hypertension  History of Present Illness:  Allon is a 36 yo with HTN.  He's done well since I last saw him a year ago. He's not exercising as much as he would like and has gained some weight. He also has been eating some salty foods. He denies any episodes of chest pain or shortness of breath.  Current Outpatient Prescriptions on File Prior to Visit  Medication Sig Dispense Refill  . acetaminophen (TYLENOL) 500 MG tablet Take 500 mg by mouth every 6 (six) hours as needed. For pain      . hydrochlorothiazide (HYDRODIURIL) 25 MG tablet Take 1 tablet (25 mg total) by mouth daily.  30 tablet  1  . metoprolol (LOPRESSOR) 50 MG tablet Take 1 tablet (50 mg total) by mouth 2 (two) times daily.  60 tablet  1  . potassium chloride (K-DUR) 10 MEQ tablet Take 1 tablet (10 mEq total) by mouth daily.  30 tablet  1  . potassium chloride (K-DUR,KLOR-CON) 10 MEQ tablet Take 10 mEq by mouth daily.        Allergies  Allergen Reactions  . Toprol Xl (Metoprolol Succinate) Hives and Itching    Past Medical History  Diagnosis Date  . Tachycardia   . Hypertension   . Hiatal hernia   . Chest pain   . Plantar fasciitis   . Prostatitis   . Abdominal pain   . Heart palpitations     Past Surgical History  Procedure Date  . Cardiac catheterization 1993    Normal heart catheterization in [place], [place]    History  Smoking status  . Never Smoker   Smokeless tobacco  . Never Used    History  Alcohol Use No    Family History  Problem Relation Age of Onset  . Diabetes Mother   . Hypertension Father     Reviw of Systems:  Reviewed in the HPI.  All other systems are  negative.  Physical Exam: Blood pressure 151/95, pulse 91, height 5\' 8"  (1.727 m), weight 199 lb 12.8 oz (90.629 kg). General: Well developed, well nourished, in no acute distress.  Head: Normocephalic, atraumatic, sclera non-icteric, mucus membranes are moist,   Neck: Supple. Carotids are 2 + without bruits. No JVD  Lungs: Clear bilaterally to auscultation.  Heart: regular rate.  normal  S1 S2. No murmurs, gallops or rubs.  Abdomen: Soft, non-tender, non-distended with normal bowel sounds. No hepatomegaly. No rebound/guarding. No masses.  Msk:  Strength and tone are normal  Extremities: No clubbing or cyanosis. No edema.  Distal pedal pulses are 2+ and equal bilaterally.  Neuro: Alert and oriented X 3. Moves all extremities spontaneously.  Psych:  Responds to questions appropriately with a normal affect.  ECG: 09/19/11 - NSR, no ST or T wave changes.  Assessment / Plan:

## 2011-09-19 NOTE — Patient Instructions (Addendum)
Try sugar free Metamucil to increase fiber.  Your physician wants you to follow-up in: 6 months You will receive a reminder letter in the mail two months in advance. If you don't receive a letter, please call our office to schedule the follow-up appointment.  Your physician recommends that you return for a FASTING lipid profile: 6 months   REDUCE HIGH SODIUM FOODS LIKE CANNED SOUP, GRAVY, SAUCES, READY PREPARED FOODS LIKE FROZEN FOODS; LEAN CUISINE, LASAGNA. BACON, SAUSAGE, LUNCH MEAT, FAST FOODS.Marland Kitchen   DASH Diet The DASH diet stands for "Dietary Approaches to Stop Hypertension." It is a healthy eating plan that has been shown to reduce high blood pressure (hypertension) in as little as 14 days, while also possibly providing other significant health benefits. These other health benefits include reducing the risk of breast cancer after menopause and reducing the risk of type 2 diabetes, heart disease, colon cancer, and stroke. Health benefits also include weight loss and slowing kidney failure in patients with chronic kidney disease.  DIET GUIDELINES  Limit salt (sodium). Your diet should contain less than 1500 mg of sodium daily.   Limit refined or processed carbohydrates. Your diet should include mostly whole grains. Desserts and added sugars should be used sparingly.   Include small amounts of heart-healthy fats. These types of fats include nuts, oils, and tub margarine. Limit saturated and trans fats. These fats have been shown to be harmful in the body.  CHOOSING FOODS  The following food groups are based on a 2000 calorie diet. See your Registered Dietitian for individual calorie needs. Grains and Grain Products (6 to 8 servings daily)  Eat More Often: Whole-wheat bread, brown rice, whole-grain or wheat pasta, quinoa, popcorn without added fat or salt (air popped).   Eat Less Often: White bread, white pasta, white rice, cornbread.  Vegetables (4 to 5 servings daily)  Eat More Often:  Fresh, frozen, and canned vegetables. Vegetables may be raw, steamed, roasted, or grilled with a minimal amount of fat.   Eat Less Often/Avoid: Creamed or fried vegetables. Vegetables in a cheese sauce.  Fruit (4 to 5 servings daily)  Eat More Often: All fresh, canned (in natural juice), or frozen fruits. Dried fruits without added sugar. One hundred percent fruit juice ( cup [237 mL] daily).   Eat Less Often: Dried fruits with added sugar. Canned fruit in light or heavy syrup.  [employer], Fish, and Poultry (2 servings or less daily. One serving is 3 to 4 oz [85-114 g]).  Eat More Often: Ninety percent or leaner ground beef, tenderloin, sirloin. Round cuts of beef, chicken breast, [place] breast. All fish. Grill, bake, or broil your meat. Nothing should be fried.   Eat Less Often/Avoid: Fatty cuts of meat, [place], or chicken leg, thigh, or wing. Fried cuts of meat or fish.  Dairy (2 to 3 servings)  Eat More Often: Low-fat or fat-free milk, low-fat plain or light yogurt, reduced-fat or part-skim cheese.   Eat Less Often/Avoid: Milk (whole, 2%, skim, or chocolate).Whole milk yogurt. Full-fat cheeses.  Nuts, Seeds, and Legumes (4 to 5 servings per week)  Eat More Often: All without added salt.   Eat Less Often/Avoid: Salted nuts and seeds, canned beans with added salt.  Fats and Sweets (limited)  Eat More Often: Vegetable oils, tub margarines without trans fats, sugar-free gelatin. Mayonnaise and salad dressings.   Eat Less Often/Avoid: Coconut oils, palm oils, butter, stick margarine, cream, half and half, cookies, candy, pie.  FOR MORE INFORMATION [employer] Sites  Diet Eating Plan: www.dashdiet.org Document Released: 01/27/2011 Document Reviewed: 01/17/2011 [facility] Patient Information 2012 [place], [place].

## 2011-09-19 NOTE — Assessment & Plan Note (Signed)
Christopher Allison's blood pressure is mildly elevated today. He's been eating salty food. I asked him to increase his exercise. I've asked watch his salt intake. I think that he'll also feel better if he loses weight. I seen again in 6 months for an office visit and fasting labs.

## 2011-10-01 ENCOUNTER — Other Ambulatory Visit: Payer: Self-pay | Admitting: Cardiovascular Disease

## 2011-10-03 NOTE — Telephone Encounter (Signed)
Fax Received. Refill Completed. Quran Vasco Chowoe (R.M.A)   

## 2011-10-04 ENCOUNTER — Telehealth: Payer: Self-pay | Admitting: Cardiovascular Disease

## 2011-10-21 ENCOUNTER — Other Ambulatory Visit: Payer: Self-pay | Admitting: Cardiovascular Disease

## 2011-10-21 NOTE — Telephone Encounter (Signed)
Fax Received. Refill Completed. Christopher Allison (R.M.A)   

## 2011-10-24 ENCOUNTER — Encounter (HOSPITAL_COMMUNITY): Payer: Self-pay | Admitting: Emergency Medicine

## 2011-10-24 ENCOUNTER — Emergency Department (HOSPITAL_COMMUNITY)
Admission: EM | Admit: 2011-10-24 | Discharge: 2011-10-25 | Disposition: A | Discharge status: Home / Self Care | Payer: BC Managed Care – PPO | Attending: Emergency Medicine | Admitting: Emergency Medicine

## 2011-10-24 DIAGNOSIS — R000 Tachycardia, unspecified: Secondary | ICD-10-CM | POA: Insufficient documentation

## 2011-10-24 DIAGNOSIS — Z760 Encounter for issue of repeat prescription: Secondary | ICD-10-CM | POA: Insufficient documentation

## 2011-10-24 DIAGNOSIS — I10 Essential (primary) hypertension: Secondary | ICD-10-CM | POA: Insufficient documentation

## 2011-10-24 MED ORDER — METOPROLOL TARTRATE 25 MG PO TABS
50.0000 mg | ORAL_TABLET | Freq: Once | ORAL | Status: AC
  Administered 2011-10-25: 50 mg via ORAL
  Filled 2011-10-24: qty 2

## 2011-10-24 MED ORDER — METOPROLOL TARTRATE 100 MG PO TABS: 50.0000 mg | ORAL_TABLET | Freq: Two times a day (BID) | ORAL | Status: DC

## 2011-10-24 NOTE — ED Notes (Signed)
PT. REPORTS HE LOST HIS METROPOLOL 50 MG TAB THIS EVENING / MISSED HIS EVENING DOSE , REQUESTING PRESCRIPTION .

## 2011-10-24 NOTE — ED Provider Notes (Signed)
History     CSN: [id number]  Arrival date & time 10/24/11  2236   None     Chief Complaint  Patient presents with  . Medication Refill    (Consider location/radiation/quality/duration/timing/severity/associated sxs/prior treatment) The history is provided by the patient. No language interpreter was used.  Christopher Allison is a 36 y.o. male who presents to the Emergency Department complaining of medication refill.  Patient states he uses metoprolol for tachycardia and lost his meds.  He denies chest pain, sob, lightheadedness.  He states he missed only his pm dose.     Past Medical History  Diagnosis Date  . Tachycardia   . Hypertension   . Hiatal hernia   . Chest pain   . Plantar fasciitis   . Prostatitis   . Abdominal pain   . Heart palpitations     Past Surgical History  Procedure Date  . Cardiac catheterization 1993    Normal heart catheterization in [place], [place]    Family History  Problem Relation Age of Onset  . Diabetes Mother   . Hypertension Father     History  Substance Use Topics  . Smoking status: Never Smoker   . Smokeless tobacco: Never Used  . Alcohol Use: No      Review of Systems  Constitutional: Negative for fever and chills.  Respiratory: Negative for shortness of breath.   Gastrointestinal: Negative for nausea and vomiting.  Neurological: Negative for weakness.    Allergies  Toprol xl  Home Medications   Current Outpatient Rx  Name Route Sig Dispense Refill  . ACETAMINOPHEN 500 MG PO TABS Oral Take 500 mg by mouth every 6 (six) hours as needed. For pain    . HYDROCHLOROTHIAZIDE 25 MG PO TABS Oral Take 25 mg by mouth daily.    Marland Kitchen METOPROLOL TARTRATE 50 MG PO TABS Oral Take 50 mg by mouth 2 (two) times daily.    Marland Kitchen POTASSIUM CHLORIDE CRYS ER 10 MEQ PO TBCR Oral Take 10 mEq by mouth daily.      BP 155/94  Pulse 136  Temp 97.7 F (36.5 C) (Oral)  Resp 18  SpO2 99%  Physical Exam  Nursing note and vitals  reviewed. Constitutional: He is oriented to person, place, and time. He appears well-developed and well-nourished. No distress.  HENT:  Head: Normocephalic and atraumatic.  Eyes: EOM are normal.  Neck: Neck supple. No tracheal deviation present.  Cardiovascular: Normal rate.   Pulmonary/Chest: Effort normal. No respiratory distress.  Musculoskeletal: Normal range of motion.  Neurological: He is alert and oriented to person, place, and time.  Skin: Skin is warm and dry.  Psychiatric: He has a normal mood and affect. His behavior is normal.    ED Course  Procedures (including critical care time) DIAGNOSTIC STUDIES: Oxygen Saturation is 99% on room air, normal by my interpretation.    COORDINATION OF CARE:   Labs Reviewed - No data to display No results found.   No diagnosis found.    Date: 10/25/2011  Rate: 86  Rhythm: normal sinus rhythm  QRS Axis: normal  Intervals: normal  ST/T Wave abnormalities: normal  Conduction Disutrbances: none  Narrative Interpretation: unremarkable     MDM  My exam reveals hr of 102 that is regular.     I personally performed the services described in this documentation, which was scribed in my presence. The recorded information has been reviewed and considered.   Hilario Quarry, MD 10/25/11 [name]

## 2011-10-26 ENCOUNTER — Other Ambulatory Visit: Payer: Self-pay | Admitting: Cardiovascular Disease

## 2011-10-27 NOTE — Telephone Encounter (Signed)
Fax Received. Refill Completed. Christopher Allison (R.M.A)   

## 2011-12-23 ENCOUNTER — Other Ambulatory Visit: Payer: Self-pay | Admitting: *Deleted

## 2011-12-23 ENCOUNTER — Other Ambulatory Visit: Payer: Self-pay

## 2011-12-23 MED ORDER — METOPROLOL TARTRATE 50 MG PO TABS: 50.0000 mg | ORAL_TABLET | Freq: Two times a day (BID) | ORAL | Status: DC

## 2011-12-23 NOTE — Telephone Encounter (Signed)
Opened in Error.

## 2012-06-26 ENCOUNTER — Encounter: Payer: Self-pay | Admitting: Cardiovascular Disease

## 2012-09-19 ENCOUNTER — Other Ambulatory Visit: Payer: Self-pay | Admitting: *Deleted

## 2012-09-19 MED ORDER — METOPROLOL TARTRATE 50 MG PO TABS: 50.0000 mg | ORAL_TABLET | Freq: Two times a day (BID) | ORAL | Status: DC

## 2012-09-19 NOTE — Telephone Encounter (Signed)
Fax Received. Refill Completed. Christopher Allison (R.M.A)   

## 2012-11-12 ENCOUNTER — Ambulatory Visit (INDEPENDENT_AMBULATORY_CARE_PROVIDER_SITE_OTHER): Payer: BC Managed Care – PPO | Admitting: Cardiovascular Disease

## 2012-11-12 ENCOUNTER — Encounter: Payer: Self-pay | Admitting: Cardiovascular Disease

## 2012-11-12 VITALS — BP 135/80 | Ht 69.0 in | Wt 197.0 lb

## 2012-11-12 DIAGNOSIS — I10 Essential (primary) hypertension: Secondary | ICD-10-CM

## 2012-11-12 MED ORDER — HYDROCHLOROTHIAZIDE 25 MG PO TABS: 25.0000 mg | ORAL_TABLET | Freq: Every day | ORAL | Status: DC

## 2012-11-12 MED ORDER — METOPROLOL TARTRATE 50 MG PO TABS: 50.0000 mg | ORAL_TABLET | Freq: Two times a day (BID) | ORAL | Status: DC

## 2012-11-12 NOTE — Patient Instructions (Addendum)
Your physician recommends that you return for lab work in: BMET IN 1 MONTH EITHER HERE OR WITH YOUR PRIMARY CARE PHYSICIAN.  Your physician wants you to follow-up in: 1 YEAR  You will receive a reminder letter in the mail two months in advance. If you don't receive a letter, please call our office to schedule the follow-up appointment.  Your physician recommends that you return for a FASTING lipid profile:1 YEAR

## 2012-11-12 NOTE — Assessment & Plan Note (Addendum)
Continues to be doing well. His blood pressure is well-controlled. We'll continue with his same medications.  He stopped taking the potassium chloride because of cost. We'll have him try taking some salt substitute. He'll need to have his potassium level checked after he starts his potassium supplementation.    Will give him a note for work today.  I'll see him again in one year.

## 2012-11-12 NOTE — Progress Notes (Signed)
     Christopher Allison Date of Birth  November 30, 1975       [facility] Office [address]  [address] [place], [place]  [address]   [place], [place]  [address] [phone]     [phone]   Fax  [phone]    Fax [phone]  Problem List: 1. Tachycardia 2. Hypertension  History of Present Illness:  Christopher Allison is a 37 yo with HTN.  He's done well since I last saw him a year ago. He's not exercising as much as he would like and has gained some weight. He also has been eating some salty foods. He denies any episodes of chest pain or shortness of breath.  Sept. 22, 2014:  Christopher Allison is doing well.  No complaints.  He is still working at [employer].  Current Outpatient Prescriptions on File Prior to Visit  Medication Sig Dispense Refill  . acetaminophen (TYLENOL) 500 MG tablet Take 500 mg by mouth every 6 (six) hours as needed. For pain      . hydrochlorothiazide (HYDRODIURIL) 25 MG tablet Take 25 mg by mouth daily.      . metoprolol (LOPRESSOR) 50 MG tablet Take 1 tablet (50 mg total) by mouth 2 (two) times daily.  60 tablet  5  . potassium chloride (K-DUR) 10 MEQ tablet TAKE ONE TABLET BY MOUTH EVERY DAY  30 tablet  5   No current facility-administered medications on file prior to visit.    Allergies  Allergen Reactions  . Toprol Xl [Metoprolol Succinate] Hives and Itching    Past Medical History  Diagnosis Date  . Tachycardia   . Hypertension   . Hiatal hernia   . Chest pain   . Plantar fasciitis   . Prostatitis   . Abdominal pain   . Heart palpitations     Past Surgical History  Procedure Laterality Date  . Cardiac catheterization  1993    Normal heart catheterization in [place], [place]    History  Smoking status  . Never Smoker   Smokeless tobacco  . Never Used    History  Alcohol Use No    Family History  Problem Relation Age of Onset  . Diabetes Mother   . Hypertension Father     Reviw of Systems:  Reviewed  in the HPI.  All other systems are negative.  Physical Exam: Blood pressure 150/80, height 5\' 9"  (1.753 m), weight 197 lb (89.359 kg). General: Well developed, well nourished, in no acute distress.  Head: Normocephalic, atraumatic, sclera non-icteric, mucus membranes are moist,   Neck: Supple. Carotids are 2 + without bruits. No JVD  Lungs: Clear bilaterally to auscultation.  Heart: regular rate.  normal  S1 S2. No murmurs, gallops or rubs.  Abdomen: Soft, non-tender, non-distended with normal bowel sounds. No hepatomegaly. No rebound/guarding. No masses.  Msk:  Strength and tone are normal  Extremities: No clubbing or cyanosis. No edema.  Distal pedal pulses are 2+ and equal bilaterally.  Neuro: Alert and oriented X 3. Moves all extremities spontaneously.  Psych:  Responds to questions appropriately with a normal affect.  ECG: Sept. 22, 2014:  NSR at 31. Normal ECg  Assessment / Plan:

## 2013-08-17 ENCOUNTER — Encounter (HOSPITAL_COMMUNITY): Payer: Self-pay | Admitting: Emergency Medicine

## 2013-08-17 ENCOUNTER — Emergency Department (HOSPITAL_COMMUNITY)
Admission: EM | Admit: 2013-08-17 | Discharge: 2013-08-17 | Disposition: A | Discharge status: Home / Self Care | Payer: BC Managed Care – PPO | Attending: Emergency Medicine | Admitting: Emergency Medicine

## 2013-08-17 DIAGNOSIS — Z8719 Personal history of other diseases of the digestive system: Secondary | ICD-10-CM | POA: Insufficient documentation

## 2013-08-17 DIAGNOSIS — Z8739 Personal history of other diseases of the musculoskeletal system and connective tissue: Secondary | ICD-10-CM | POA: Insufficient documentation

## 2013-08-17 DIAGNOSIS — R011 Cardiac murmur, unspecified: Secondary | ICD-10-CM | POA: Insufficient documentation

## 2013-08-17 DIAGNOSIS — I10 Essential (primary) hypertension: Secondary | ICD-10-CM | POA: Insufficient documentation

## 2013-08-17 DIAGNOSIS — Z79899 Other long term (current) drug therapy: Secondary | ICD-10-CM | POA: Insufficient documentation

## 2013-08-17 DIAGNOSIS — Z87448 Personal history of other diseases of urinary system: Secondary | ICD-10-CM | POA: Insufficient documentation

## 2013-08-17 DIAGNOSIS — Z760 Encounter for issue of repeat prescription: Secondary | ICD-10-CM

## 2013-08-17 MED ORDER — METOPROLOL TARTRATE 50 MG PO TABS: 50.0000 mg | ORAL_TABLET | Freq: Two times a day (BID) | ORAL | Status: DC

## 2013-08-17 MED ORDER — METOPROLOL TARTRATE 25 MG PO TABS
50.0000 mg | ORAL_TABLET | Freq: Once | ORAL | Status: AC
  Administered 2013-08-17: 50 mg via ORAL
  Filled 2013-08-17: qty 2

## 2013-08-17 NOTE — Discharge Instructions (Signed)
Please read and follow all provided instructions.  Your diagnoses today include:  1. Medication refill   2. HTN (hypertension)     Tests performed today include:  Vital signs. See below for your results today.   Medications prescribed:   None  Home care instructions:  Follow any educational materials contained in this packet.  Follow-up instructions: Please follow-up with your primary care provider as needed for further evaluation of your symptoms.  Return instructions:   Please return to the Emergency Department if you experience worsening symptoms.   Please return if you have any other emergent concerns.  Additional Information:  Your vital signs today were: BP 135/82   Pulse 93   Temp(Src) 98.7 F (37.1 C) (Oral)   Resp 20   Ht 5\' 9"  (1.753 m)   Wt 210 lb (95.255 kg)   BMI 31.00 kg/m2   SpO2 99% If your blood pressure (BP) was elevated above 135/85 this visit, please have this repeated by your doctor within one month. ---------------

## 2013-08-17 NOTE — ED Notes (Signed)
The patient said he has been out of metoprolol since earlier today.  Patient is here to get a prescription.  The patient said he normally takes two pills and he has only taken one pill today.  Patient denies any pain, SOB or any other symptoms.

## 2013-08-17 NOTE — ED Provider Notes (Signed)
CSN: [id number]     Arrival date & time 08/17/13  2107 History  This chart was scribed for non-physician practitioner working with Richardean Canalavid H Yao, MD by Elveria Risingimelie Horne, ED Scribe. This patient was seen in room TR09C/TR09C and the patient's care was started at 9:27 PM.   Chief Complaint  Patient presents with  . Medication Refill    The patient said he has been out of metoprolol since earlier today.  Patient is here to get a prescription.     The history is provided by the patient. No language interpreter was used.   HPI Comments: Christopher Allison is a 38 y.o. male with history of heart murmur who presents to the Emergency Department requesting medication refill for Metroprolol. Patient states that [facility] and [facility] wouldn't take his prescription. Patient reports palpitations when he is not taking his medication. He has taken one tablet and wants to prevent onset racing heart. Patient denies any medical complains.      Past Medical History  Diagnosis Date  . Tachycardia   . Hypertension   . Hiatal hernia   . Chest pain   . Plantar fasciitis   . Prostatitis   . Abdominal pain   . Heart palpitations    Past Surgical History  Procedure Laterality Date  . Cardiac catheterization  1993    [place] heart [place]   Family History  Problem Relation Age of Onset  . Diabetes Mother   . Hypertension Father    History  Substance Use Topics  . Smoking status: Never Smoker   . Smokeless tobacco: Never Used  . Alcohol Use: No    Review of Systems  Constitutional: Negative for fever and chills.  Respiratory: Negative for shortness of breath.   Cardiovascular: Negative for chest pain and palpitations.  Neurological: Negative for headaches.      Allergies  Toprol xl  Home Medications   Prior to Admission medications   Medication Sig Start Date End Date Taking? Authorizing Gladyce Mcray  acetaminophen (TYLENOL) 500 MG tablet Take 500 mg  by mouth every 6 (six) hours as needed. For pain    Historical Johannes Everage, MD  hydrochlorothiazide (HYDRODIURIL) 25 MG tablet Take 1 tablet (25 mg total) by mouth daily. 11/12/12   Vesta MixerPhilip J Nahser, MD  metoprolol (LOPRESSOR) 50 MG tablet Take 1 tablet (50 mg total) by mouth 2 (two) times daily. 11/12/12   Vesta MixerPhilip J Nahser, MD   Triage Vitals: BP 135/82  Pulse 93  Temp(Src) 98.7 F (37.1 C) (Oral)  Resp 20  Ht 5\' 9"  (1.753 m)  Wt 210 lb (95.255 kg)  BMI 31.00 kg/m2  SpO2 99% Physical Exam  Nursing note and vitals reviewed. Constitutional: He appears well-developed and well-nourished. No distress.  HENT:  Head: Normocephalic and atraumatic.  Eyes: EOM are normal.  Neck: Neck supple.  Cardiovascular: Normal rate.   No murmur heard. Pulmonary/Chest: Effort normal. No respiratory distress.  Musculoskeletal: Normal range of motion.  Neurological: He is alert.  Skin: Skin is warm and dry.  Psychiatric: He has a normal mood and affect. His behavior is normal.    ED Course  Procedures (including critical care time)  COORDINATION OF CARE: 9:26 PM- Discussed treatment plan with patient at bedside and patient agreed to plan.   Labs Review Labs Reviewed - No data to display  Imaging Review No results found.   EKG Interpretation None      Patient seen and examined. Medications  ordered.   Vital signs reviewed and are as follows: Filed Vitals:   08/17/13 2123  BP: 135/82  Pulse: 93  Temp: 98.7 F (37.1 C)  Resp: 20   Rx metoprolol # 15 days.   Patient urged to return with worsening symptoms or other concerns. Patient verbalized understanding and agrees with plan.     MDM   Final diagnoses:  Medication refill   Med refill, no acute medical complaints.   I personally performed the services described in this documentation, which was scribed in my presence. The recorded information has been reviewed and is accurate.     Renne CriglerJoshua Geiple, PA-C 08/17/13 2144

## 2013-08-19 ENCOUNTER — Telehealth: Payer: Self-pay | Admitting: Family Medicine

## 2013-08-19 NOTE — Telephone Encounter (Signed)
Called pt home, phone states voice mail not set up.  Letter sent to pt.

## 2013-08-20 NOTE — ED Provider Notes (Signed)
Medical screening examination/treatment/procedure(s) were performed by non-physician practitioner and as supervising physician I was immediately available for consultation/collaboration.   EKG Interpretation None        David H Yao, MD 08/20/13 0716 

## 2013-11-14 ENCOUNTER — Other Ambulatory Visit: Payer: Self-pay | Admitting: *Deleted

## 2013-11-14 MED ORDER — METOPROLOL TARTRATE 50 MG PO TABS: 50.0000 mg | ORAL_TABLET | Freq: Two times a day (BID) | ORAL | Status: DC

## 2013-11-20 ENCOUNTER — Other Ambulatory Visit: Payer: Self-pay | Admitting: Cardiovascular Disease

## 2013-11-21 ENCOUNTER — Other Ambulatory Visit: Payer: Self-pay | Admitting: Cardiovascular Disease

## 2013-12-17 ENCOUNTER — Encounter: Payer: Self-pay | Admitting: Cardiovascular Disease

## 2013-12-17 ENCOUNTER — Ambulatory Visit (INDEPENDENT_AMBULATORY_CARE_PROVIDER_SITE_OTHER): Payer: BC Managed Care – PPO | Admitting: Cardiovascular Disease

## 2013-12-17 VITALS — BP 144/104 | HR 61 | Ht 69.0 in | Wt 215.1 lb

## 2013-12-17 DIAGNOSIS — I471 Supraventricular tachycardia: Secondary | ICD-10-CM

## 2013-12-17 DIAGNOSIS — I10 Essential (primary) hypertension: Secondary | ICD-10-CM

## 2013-12-17 DIAGNOSIS — R000 Tachycardia, unspecified: Secondary | ICD-10-CM | POA: Insufficient documentation

## 2013-12-17 LAB — BASIC METABOLIC PANEL
BUN: 9 mg/dL (ref 6–23)
CALCIUM: 9.5 mg/dL (ref 8.4–10.5)
CO2: 28 meq/L (ref 19–32)
CREATININE: 1.2 mg/dL (ref 0.4–1.5)
Chloride: 98 mEq/L (ref 96–112)
GFR: 85.07 mL/min (ref >60.00)
GLUCOSE: 96 mg/dL (ref 70–99)
Potassium: 3.8 mEq/L (ref 3.5–5.1)
Sodium: 135 mEq/L (ref 135–145)

## 2013-12-17 MED ORDER — METOPROLOL TARTRATE 50 MG PO TABS: 50.0000 mg | ORAL_TABLET | Freq: Two times a day (BID) | ORAL | Status: DC

## 2013-12-17 NOTE — Progress Notes (Signed)
     Christopher Dickerimothy L Beckstrom Date of Birth  09/11/1975       [facility]    [place] Office [address]  [address] [place], [place]  [address]   [place], [place]  [address] [phone]     [phone]   Fax  [phone]    Fax [phone]  Problem List: 1. Tachycardia 2. Hypertension  History of Present Illness:  Christopher Allison is a 38 yo with HTN.  He's done well since I last saw him a year ago. He's not exercising as much as he would like and has gained some weight. He also has been eating some salty foods. He denies any episodes of chest pain or shortness of breath.  Sept. 22, 2014:  Christopher Allison is doing well.  No complaints.  He is still working at [employer].  Oct. 27, 2015:  Christopher Allison is doing ok.  Still working at [employer]. 5 PM to 10 PM.   Still eating fast foods.   Eats at [facility] [employer] frequently.   Current Outpatient Prescriptions on File Prior to Visit  Medication Sig Dispense Refill  . acetaminophen (TYLENOL) 500 MG tablet Take 500 mg by mouth every 6 (six) hours as needed. For pain      . hydrochlorothiazide (HYDRODIURIL) 25 MG tablet TAKE ONE TABLET BY MOUTH ONCE DAILY  30 tablet  0  . metoprolol (LOPRESSOR) 50 MG tablet Take 1 tablet (50 mg total) by mouth 2 (two) times daily.  60 tablet  0   No current facility-administered medications on file prior to visit.    Allergies  Allergen Reactions  . Toprol Xl [Metoprolol Succinate] Hives and Itching    Past Medical History  Diagnosis Date  . Tachycardia   . Hypertension   . Hiatal hernia   . Chest pain   . Plantar fasciitis   . Prostatitis   . Abdominal pain   . Heart palpitations     Past Surgical History  Procedure Laterality Date  . Cardiac catheterization  1993    [place], [place]    History  Smoking status  . Never Smoker   Smokeless tobacco  . Never Used    History  Alcohol Use No    Family History  Problem Relation Age of Onset  . Diabetes Mother     . Hypertension Father     Reviw of Systems:  Reviewed in the HPI.  All other systems are negative.  Physical Exam: Blood pressure 144/104, pulse 61, height 5\' 9"  (1.753 m), weight 215 lb 1.9 oz (97.578 kg). General: Well developed, well nourished, in no acute distress.  Head: Normocephalic, atraumatic, sclera non-icteric, mucus membranes are moist,   Neck: Supple. Carotids are 2 + without bruits. No JVD  Lungs: Clear bilaterally to auscultation.  Heart: regular rate.  normal  S1 S2. No murmurs, gallops or rubs.  Abdomen: Soft, non-tender, non-distended with normal bowel sounds. No hepatomegaly. No rebound/guarding. No masses.  Msk:  Strength and tone are normal  Extremities: No clubbing or cyanosis. No edema.  Distal pedal pulses are 2+ and equal bilaterally.  Neuro: Alert and oriented X 3. Moves all extremities spontaneously.  Psych:  Responds to questions appropriately with a normal affect.  ECG: 12/12/2013: Normal sinus rhythm at 61. His early report changes. There are no ST or T wave changes.  Assessment / Plan:

## 2013-12-17 NOTE — Patient Instructions (Signed)
Your physician recommends that you have lab work:  TODAY - basic metabolic panel  Your physician recommends that you continue on your current medications as directed. Please refer to the Current Medication list given to you today.  REDUCE HIGH SODIUM FOODS LIKE CANNED SOUP, GRAVY, SAUCES, READY PREPARED FOODS LIKE FROZEN FOODS; LEAN CUISINE, LASAGNA. BACON, SAUSAGE, LUNCH MEAT, FAST FOODS, HOT DOGS, CHIPS, PIZZA.   Your physician wants you to follow-up in: 1 year with Dr. Elease HashimotoNahser.  You will receive a reminder letter in the mail two months in advance. If you don't receive a letter, please call our office to schedule the follow-up appointment.

## 2013-12-17 NOTE — Assessment & Plan Note (Signed)
Hr is better on metoprolol

## 2013-12-17 NOTE — Assessment & Plan Note (Signed)
Diastolic blood pressure still elevated. He eats way too much salt. I've asked him to reduce the salt in his diet.  He needs to exercise. Continue meds Check BMP today. Will see in a year

## 2013-12-18 ENCOUNTER — Telehealth: Payer: Self-pay | Admitting: Nurse Practitioner

## 2013-12-18 MED ORDER — POTASSIUM CHLORIDE 20 MEQ PO PACK: 20.0000 meq | PACK | Freq: Every day | ORAL | Status: DC

## 2013-12-18 NOTE — Telephone Encounter (Signed)
Spoke with patient and reviewed lab results and need to start Potassium supplement 20 meq once daily.  Patient states he will be seeing Dr. Susann GivensLalonde, PCP, within the next few months.  I advised him to get his potassium level rechecked and to fax a copy to our office.  Patient verbalized understanding and agreement. Rx to patient's pharmacy

## 2013-12-18 NOTE — Telephone Encounter (Signed)
Message copied by Levi AlandSWINYER, Medea Deines M on Wed Dec 18, 2013 10:36 AM ------      Message from: Vesta MixerNAHSER, PHILIP J      Created: Tue Dec 17, 2013  5:39 PM       Add kdur 20 meq a day ------

## 2014-01-06 ENCOUNTER — Other Ambulatory Visit: Payer: Self-pay | Admitting: Cardiovascular Disease

## 2014-02-27 ENCOUNTER — Other Ambulatory Visit: Payer: Self-pay | Admitting: *Deleted

## 2014-02-27 MED ORDER — HYDROCHLOROTHIAZIDE 25 MG PO TABS: 25.0000 mg | ORAL_TABLET | Freq: Every day | ORAL | Status: DC

## 2014-12-12 ENCOUNTER — Other Ambulatory Visit: Payer: Self-pay

## 2014-12-12 DIAGNOSIS — R000 Tachycardia, unspecified: Secondary | ICD-10-CM

## 2014-12-12 DIAGNOSIS — I10 Essential (primary) hypertension: Secondary | ICD-10-CM

## 2014-12-12 MED ORDER — METOPROLOL TARTRATE 50 MG PO TABS: 50.0000 mg | ORAL_TABLET | Freq: Two times a day (BID) | ORAL | Status: DC

## 2014-12-18 ENCOUNTER — Ambulatory Visit (INDEPENDENT_AMBULATORY_CARE_PROVIDER_SITE_OTHER): Payer: BLUE CROSS/BLUE SHIELD | Admitting: Cardiovascular Disease

## 2014-12-18 ENCOUNTER — Encounter: Payer: Self-pay | Admitting: Cardiovascular Disease

## 2014-12-18 VITALS — BP 130/90 | HR 71 | Ht 69.0 in | Wt 203.0 lb

## 2014-12-18 DIAGNOSIS — I10 Essential (primary) hypertension: Secondary | ICD-10-CM | POA: Diagnosis not present

## 2014-12-18 DIAGNOSIS — Z13220 Encounter for screening for lipoid disorders: Secondary | ICD-10-CM | POA: Diagnosis not present

## 2014-12-18 NOTE — Progress Notes (Signed)
Christopher Allison Date of Birth  1975/04/29       [facility] Office [address]  [address] [place], [place]  [address]   [place], [place]  [address] [phone]     [phone]   Fax  [phone]    Fax [phone]  Problem List: 1. Tachycardia 2. Hypertension  History of Present Illness:  Christopher Allison is a 39 yo with HTN.  He's done well since I last saw him a year ago. He's not exercising as much as he would like and has gained some weight. He also has been eating some salty foods. He denies any episodes of chest pain or shortness of breath.  Sept. 22, 2014:  Christopher Allison is doing well.  No complaints.  He is still working at [employer].  Oct. 27, 2015:  Christopher Allison is doing ok.  Still working at [employer]. 5 PM to 10 PM.   Still eating fast foods.   Eats at [facility] frequently.   Oct. 27, 2016:  Doing well.  Still at [employer].     Current Outpatient Prescriptions on File Prior to Visit  Medication Sig Dispense Refill  . acetaminophen (TYLENOL) 500 MG tablet Take 500 mg by mouth every 6 (six) hours as needed. For pain    . hydrochlorothiazide (HYDRODIURIL) 25 MG tablet Take 1 tablet (25 mg total) by mouth daily. 30 tablet 6  . metoprolol (LOPRESSOR) 50 MG tablet Take 1 tablet (50 mg total) by mouth 2 (two) times daily. 60 tablet 0  . potassium chloride (KLOR-CON) 20 MEQ packet Take 20 mEq by mouth daily. 30 tablet 11   No current facility-administered medications on file prior to visit.    Allergies  Allergen Reactions  . Toprol Xl [Metoprolol Succinate] Hives and Itching    Past Medical History  Diagnosis Date  . Tachycardia   . Hypertension   . Hiatal hernia   . Chest pain   . Plantar fasciitis   . Prostatitis   . Abdominal pain   . Heart palpitations     Past Surgical History  Procedure Laterality Date  . Cardiac catheterization  1993    Normal heart catheterization in [place], [place]    History  Smoking status  . Never  Smoker   Smokeless tobacco  . Never Used    History  Alcohol Use No    Family History  Problem Relation Age of Onset  . Diabetes Mother   . Hypertension Father     Reviw of Systems:  Reviewed in the HPI.  All other systems are negative.  Physical Exam: Blood pressure 130/90, pulse 71, height  (1.753 m), weight 203 lb (92.08 kg). General: Well developed, well nourished, in no acute distress.  Head: Normocephalic, atraumatic, sclera non-icteric, mucus membranes are moist,   Neck: Supple. Carotids are 2 + without bruits. No JVD  Lungs: Clear bilaterally to auscultation.  Heart: regular rate.  normal  S1 S2. No murmurs, gallops or rubs.  Abdomen: Soft, non-tender, non-distended with normal bowel sounds. No hepatomegaly. No rebound/guarding. No masses.  Msk:  Strength and tone are normal  Extremities: No clubbing or cyanosis. No edema.  Distal pedal pulses are 2+ and equal bilaterally.  Neuro: Alert and oriented X 3. Moves all extremities spontaneously.  Psych:  Responds to questions appropriately with a normal affect.  ECG: Oct. 27, 2016:  NSR at 71.   Normal ECG  Assessment / Plan:  1. Tachycardia - stable  2. Hypertension:  BP is well controlled.  Continue current meds.  Will get BMP, Liver enz, . Lipids for general screening      Nahser, Deloris PingPhilip J, MD  12/18/2014 2:39 PM    [facility] [address]  [address] [place], [place]  [address] Pager [phone] Phone: [phone]; Fax: [phone]   [facility]  [address] [place], [place]  [address] [phone]   Fax [phone]

## 2014-12-18 NOTE — Patient Instructions (Signed)
Medication Instructions:  Your physician recommends that you continue on your current medications as directed. Please refer to the Current Medication list given to you today.   Labwork: TODAY - cholesterol, liver, basic metabolic panel   Testing/Procedures: None Ordered   Follow-Up: Your physician wants you to follow-up in: 1 year with Dr. Nahser.  You will receive a reminder letter in the mail two months in advance. If you don't receive a letter, please call our office to schedule the follow-up appointment.   If you need a refill on your cardiac medications before your next appointment, please call your pharmacy.   Thank you for choosing [facility]! Michelle Swinyer, RN [phone]   

## 2014-12-19 LAB — LIPID PANEL
CHOLESTEROL: 202 mg/dL — AB (ref 125–200)
HDL: 40 mg/dL (ref >=40)
LDL CALC: 142 mg/dL — AB (ref <130)
TRIGLYCERIDES: 102 mg/dL (ref <150)
Total CHOL/HDL Ratio: 5.1 Ratio — ABNORMAL HIGH (ref <=5.0)
VLDL: 20 mg/dL (ref <30)

## 2014-12-19 LAB — BASIC METABOLIC PANEL
BUN: 7 mg/dL (ref 7–25)
CALCIUM: 10.1 mg/dL (ref 8.6–10.3)
CHLORIDE: 100 mmol/L (ref 98–110)
CO2: 29 mmol/L (ref 20–31)
CREATININE: 1.05 mg/dL (ref 0.60–1.35)
GLUCOSE: 81 mg/dL (ref 65–99)
Potassium: 4.5 mmol/L (ref 3.5–5.3)
Sodium: 140 mmol/L (ref 135–146)

## 2014-12-19 LAB — HEPATIC FUNCTION PANEL
ALBUMIN: 4.7 g/dL (ref 3.6–5.1)
ALT: 27 U/L (ref 9–46)
AST: 25 U/L (ref 10–40)
Alkaline Phosphatase: 92 U/L (ref 40–115)
BILIRUBIN INDIRECT: 0.8 mg/dL (ref 0.2–1.2)
Bilirubin, Direct: 0.2 mg/dL (ref <=0.2)
TOTAL PROTEIN: 7.7 g/dL (ref 6.1–8.1)
Total Bilirubin: 1 mg/dL (ref 0.2–1.2)

## 2015-01-13 ENCOUNTER — Other Ambulatory Visit: Payer: Self-pay | Admitting: Cardiovascular Disease

## 2015-12-14 ENCOUNTER — Encounter (HOSPITAL_COMMUNITY): Payer: Self-pay

## 2015-12-14 ENCOUNTER — Encounter: Payer: Self-pay | Admitting: Cardiovascular Disease

## 2015-12-14 ENCOUNTER — Ambulatory Visit (INDEPENDENT_AMBULATORY_CARE_PROVIDER_SITE_OTHER): Payer: BLUE CROSS/BLUE SHIELD | Admitting: Cardiovascular Disease

## 2015-12-14 ENCOUNTER — Encounter (INDEPENDENT_AMBULATORY_CARE_PROVIDER_SITE_OTHER): Payer: Self-pay

## 2015-12-14 ENCOUNTER — Emergency Department (HOSPITAL_COMMUNITY)
Admission: EM | Admit: 2015-12-14 | Discharge: 2015-12-14 | Disposition: A | Discharge status: Home / Self Care | Payer: BLUE CROSS/BLUE SHIELD | Attending: Emergency Medicine | Admitting: Emergency Medicine

## 2015-12-14 ENCOUNTER — Emergency Department (HOSPITAL_COMMUNITY): Payer: BLUE CROSS/BLUE SHIELD

## 2015-12-14 VITALS — BP 150/110 | HR 63 | Ht 69.0 in | Wt 211.4 lb

## 2015-12-14 DIAGNOSIS — R000 Tachycardia, unspecified: Secondary | ICD-10-CM | POA: Diagnosis not present

## 2015-12-14 DIAGNOSIS — I10 Essential (primary) hypertension: Secondary | ICD-10-CM

## 2015-12-14 DIAGNOSIS — R0789 Other chest pain: Secondary | ICD-10-CM | POA: Diagnosis not present

## 2015-12-14 LAB — BASIC METABOLIC PANEL
ANION GAP: 10 (ref 5–15)
BUN: 7 mg/dL (ref 6–20)
CHLORIDE: 100 mmol/L — AB (ref 101–111)
CO2: 27 mmol/L (ref 22–32)
Calcium: 9.7 mg/dL (ref 8.9–10.3)
Creatinine, Ser: 1.18 mg/dL (ref 0.61–1.24)
GFR calc Af Amer: >60 mL/min (ref >60)
GLUCOSE: 110 mg/dL — AB (ref 65–99)
POTASSIUM: 3.6 mmol/L (ref 3.5–5.1)
Sodium: 137 mmol/L (ref 135–145)

## 2015-12-14 LAB — I-STAT TROPONIN, ED: Troponin i, poc: 0.01 ng/mL (ref 0.00–0.08)

## 2015-12-14 LAB — CBC
HEMATOCRIT: 47.8 % (ref 39.0–52.0)
HEMOGLOBIN: 16.6 g/dL (ref 13.0–17.0)
MCH: 27 pg (ref 26.0–34.0)
MCHC: 34.7 g/dL (ref 30.0–36.0)
MCV: 77.9 fL — AB (ref 78.0–100.0)
Platelets: 257 10*3/uL (ref 150–400)
RBC: 6.14 MIL/uL — ABNORMAL HIGH (ref 4.22–5.81)
RDW: 13.8 % (ref 11.5–15.5)
WBC: 7.7 10*3/uL (ref 4.0–10.5)

## 2015-12-14 MED ORDER — HYDROCHLOROTHIAZIDE 25 MG PO TABS: 25.0000 mg | ORAL_TABLET | Freq: Every day | ORAL | 3 refills | Status: DC

## 2015-12-14 MED ORDER — METOPROLOL TARTRATE 50 MG PO TABS: 50.0000 mg | ORAL_TABLET | Freq: Two times a day (BID) | ORAL | 3 refills | Status: DC

## 2015-12-14 MED ORDER — POTASSIUM CHLORIDE 20 MEQ PO PACK: 20.0000 meq | PACK | Freq: Every day | ORAL | 3 refills | Status: DC

## 2015-12-14 NOTE — ED Triage Notes (Addendum)
Pt reports [facility] today and told he had elevated BP. Pt reports that he has not been taking BP meds recently. Pt denies headache but does endorse slight chest discomfort that has since resolved.

## 2015-12-14 NOTE — Progress Notes (Signed)
Christopher Allison Date of Birth  02/20/1976       [facility]    [place] Office [address]  [address] [place], [place]  [address]   [place], [place]  [address] [phone]     [phone]   Fax  [phone]    Fax [phone]  Problem List: 1. Tachycardia 2. Hypertension  History of Present Illness:  Christopher Allison is a 40 yo with HTN.  He's done well since I last saw him a year ago. He's not exercising as much as he would like and has gained some weight. He also has been eating some salty foods. He denies any episodes of chest pain or shortness of breath.  Sept. 22, 2014:  Christopher Allison is doing well.  No complaints.  He is still working at [employer].  Oct. 27, 2015:  Christopher Allison is doing ok.  Still working at [employer]. 5 PM to 10 PM.   Still eating fast foods.   E[facility] frequently.   Oct. 27, 2016:  Doing well.  Still at [employer].  Oct. 23, 2017:  Christopher Allison is seen for his 1 year office visit . Ran out of his HCTZ  several months ago Also is eating lots of salty foods.    Current Outpatient Prescriptions on File Prior to Visit  Medication Sig Dispense Refill  . acetaminophen (TYLENOL) 500 MG tablet Take 500 mg by mouth every 6 (six) hours as needed. For pain    . hydrochlorothiazide (HYDRODIURIL) 25 MG tablet Take 1 tablet (25 mg total) by mouth daily. 30 tablet 6  . metoprolol (LOPRESSOR) 50 MG tablet TAKE ONE TABLET BY MOUTH TWICE DAILY 60 tablet 11  . potassium chloride (KLOR-CON) 20 MEQ packet Take 20 mEq by mouth daily. 30 tablet 11   No current facility-administered medications on file prior to visit.     Allergies  Allergen Reactions  . Toprol Xl [Metoprolol Succinate] Hives and Itching    Past Medical History:  Diagnosis Date  . Abdominal pain   . Chest pain   . Heart palpitations   . Hiatal hernia   . Hypertension   . Plantar fasciitis   . Prostatitis   . Tachycardia     Past Surgical History:  Procedure Laterality Date  .  CARDIAC CATHETERIZATION  1993   [place] heart [place], [place]    History  Smoking Status  . Never Smoker  Smokeless Tobacco  . Never Used    History  Alcohol Use No    Family History  Problem Relation Age of Onset  . Diabetes Mother   . Hypertension Father     Reviw of Systems:  Reviewed in the HPI.  All other systems are negative.  Physical Exam: Blood pressure (!) 150/110, pulse 63, height 5\' 9"  (1.753 m), weight 211 lb 6.4 oz (95.9 kg).   General: Well developed, well nourished, in no acute distress.  Head: Normocephalic, atraumatic, sclera non-icteric, mucus membranes are moist,   Neck: Supple. Carotids are 2 + without bruits. No JVD  Lungs: Clear bilaterally to auscultation.  Heart: regular rate.  normal  S1 S2. No murmurs, gallops or rubs.  Abdomen: Soft, non-tender, non-distended with normal bowel sounds. No hepatomegaly. No rebound/guarding. No masses.  Msk:  Strength and tone are normal  Extremities: No clubbing or cyanosis. No edema.  Distal pedal pulses are 2+ and equal bilaterally.  Neuro: Alert and oriented X 3. Moves all extremities spontaneously.  Psych:  Responds to questions appropriately with a normal affect.  ECG: Oct. 23 ,2017;  NSR at 63   Normal ECG  Assessment / Plan:   1. Tachycardia - stable  2. Hypertension:  Has run out of HCTZ.  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  He needs to be working at least 40 hours a week    Kristeen Miss, MD  12/14/2015 2:15 PM    [facility] [address] [place],  [address] [place], [place]  [address] Pager [phone] Phone: [phone]; Fax: [phone]

## 2015-12-14 NOTE — ED Notes (Signed)
Patient repeatedly belching. Reports that he has a hiatal hernia.  Patient reports that PCP prescribed HCTz today in addition to Metoprolol that patient has been taking. Patient had already been prescribed same medication, but has not been taking for BP management.

## 2015-12-14 NOTE — Patient Instructions (Signed)
Medication Instructions:  Your physician recommends that you continue on your current medications as directed. Please refer to the Current Medication list given to you today.   Labwork: Your physician recommends that you return for lab work in: 4 weeks for basic metabolic panel   Testing/Procedures: None Ordered   Follow-Up: Your physician recommends that you schedule a follow-up appointment in: 3 months with Dr. Elease HashimotoNahser.    If you need a refill on your cardiac medications before your next appointment, please call your pharmacy.   Thank you for choosing [facility]! Eligha BridegroomMichelle Lesleyanne Politte, RN [phone]

## 2015-12-14 NOTE — ED Provider Notes (Signed)
[facility] Provider Note   CSN: [id number] Arrival date & time: 12/14/15  1857     History   Chief Complaint Chief Complaint  Patient presents with  . Hypertension    HPI Christopher Allison is a 40 y.o. male with a hx of hiatal hernia, palpitations, HTN,  that presents to the ED stating that he was seen by his PCP earlier today and found to have HTN, patient admits to noncompliance, and states that he plans to restart taking his medications as rx'd  He states that his PCP refilled them and he took a dose of his HCTZ. He states that after this he went to work and felt like his blood pressure was still high and he had an episode of epigastric/lower left chest discomfort which he states "felt like gas with his hiatal hernia." This has spontaneously resolved after several episodes of belching. He denies any further discomfort. He had no associated nausea, vomiting, diaphoresis, SOB, chest tightness, pleurisy, or radiation of his sx. He presents to the ED for further evaluation of his heart. He has a history of a negative cath years prior which is the evaluation which led him to discover his hiatal hernia. He has not taken anything else for his sx. No hx of CAD. He states that he is about due for his metoprolol dosing and his blood pressure is elevated because of that.   HPI  Past Medical History:  Diagnosis Date  . Abdominal pain   . Chest pain   . Heart palpitations   . Hiatal hernia   . Hypertension   . Plantar fasciitis   . Prostatitis   . Tachycardia     Patient Active Problem List   Diagnosis Date Noted  . Sinus tachycardia 12/17/2013  . Hypertension 12/03/2010    Past Surgical History:  Procedure Laterality Date  . CARDIAC CATHETERIZATION  1993   [place]       Home Medications    Prior to Admission medications   Medication Sig Start Date End Date Taking? Authorizing Provider  acetaminophen (TYLENOL) 500 MG tablet Take  500 mg by mouth every 6 (six) hours as needed for mild pain. For pain    Yes Historical Provider, MD  hydrochlorothiazide (HYDRODIURIL) 25 MG tablet Take 1 tablet (25 mg total) by mouth daily. 12/14/15  Yes Vesta MixerPhilip J Nahser, MD  metoprolol (LOPRESSOR) 50 MG tablet Take 1 tablet (50 mg total) by mouth 2 (two) times daily. 12/14/15  Yes Vesta MixerPhilip J Nahser, MD  potassium chloride (KLOR-CON) 20 MEQ packet Take 20 mEq by mouth daily. 12/14/15   Vesta MixerPhilip J Nahser, MD    Family History Family History  Problem Relation Age of Onset  . Hypertension Father   . Diabetes Mother     Social History Social History  Substance Use Topics  . Smoking status: Never Smoker  . Smokeless tobacco: Never Used  . Alcohol use No     Allergies   Toprol xl [metoprolol succinate]   Review of Systems Review of Systems  Constitutional: Negative for chills and fever.  Respiratory: Negative for cough, chest tightness and shortness of breath.   Cardiovascular: Positive for chest pain. Negative for palpitations and leg swelling.  Gastrointestinal: Positive for abdominal pain. Negative for abdominal distention, blood in stool, diarrhea, nausea and vomiting.  Musculoskeletal: Negative for back pain and neck pain.  Skin: Negative for rash.  Neurological: Negative for syncope, light-headedness and headaches.  All other systems reviewed  and are negative.    Physical Exam Updated Vital Signs BP (!) 157/101 (BP Location: Right Arm)   Pulse 86   Temp 98.2 F (36.8 C) (Oral)   Resp 16   SpO2 99%   Physical Exam  Constitutional: He is oriented to person, place, and time. He appears well-developed and well-nourished. No distress.  HENT:  Head: Normocephalic and atraumatic.  Nose: Nose normal.  Mouth/Throat: Oropharynx is clear and moist.  Eyes: Conjunctivae and EOM are normal. Pupils are equal, round, and reactive to light.  Neck: Neck supple.  Cardiovascular: Normal rate, regular rhythm and intact distal pulses.    Occasional extrasystoles are present.  Murmur heard. Pulmonary/Chest: Effort normal and breath sounds normal. He exhibits no tenderness.  Abdominal: Soft. He exhibits no distension. There is no tenderness.  Musculoskeletal: He exhibits no edema or tenderness.  Neurological: He is alert and oriented to person, place, and time. No cranial nerve deficit. Coordination normal.  Skin: Skin is warm and dry. No rash noted. He is not diaphoretic.  Nursing note and vitals reviewed.    ED Treatments / Results  Labs (all labs ordered are listed, but only abnormal results are displayed) Labs Reviewed  BASIC METABOLIC PANEL - Abnormal; Notable for the following:       Result Value   Chloride 100 (*)    Glucose, Bld 110 (*)    All other components within normal limits  CBC - Abnormal; Notable for the following:    RBC 6.14 (*)    MCV 77.9 (*)    All other components within normal limits  I-STAT TROPOININ, ED    EKG  EKG Interpretation  Date/Time:  Monday December 14 2015 19:07:50 EDT Ventricular Rate:  74 PR Interval:  158 QRS Duration: 72 QT Interval:  364 QTC Calculation: 404 R Axis:   17 Text Interpretation:  Normal sinus rhythm T wave abnormality, consider inferolateral ischemia Abnormal ECG Confirmed by DELO  MD, DOUGLAS ([id number]) on 12/14/2015 10:45:47 PM       Radiology Dg Chest 2 View  Result Date: 12/14/2015 CLINICAL DATA:  Elevated blood pressure. Taking medications for tachycardia. Nonsmoker. EXAM: CHEST  2 VIEW COMPARISON:  03/27/2010 radiographs. FINDINGS: The heart size and mediastinal contours are normal. The lungs are clear. There is no pleural effusion or pneumothorax. No acute osseous findings are identified. IMPRESSION: Stable chest.  No active cardiopulmonary process. Electronically Signed   By: Carey Bullocks M.D.   On: 12/14/2015 20:43    Procedures Procedures (including critical care time)  Medications Ordered in ED Medications - No data to  display   Initial Impression / Assessment and Plan / ED Course  I have reviewed the triage vital signs and the nursing notes.  Pertinent labs & imaging results that were available during my care of the patient were reviewed by me and considered in my medical decision making (see chart for details).  Clinical Course  40 y.o. male presents after he had an episode of epigastric and lower chest discomfort thought to be due to gas. EKG showed lateral Twave inversions but when compared to previous they appear to be chronic. Patient remains asymptomatic and well appearing. CXR neg. Labs were drawn and returned showing negative troponin and no other significant abnormalities. Reassurance was given and he was recommended to take his medications as rx'd and to follow up with his PCP for further evaluation of his sx. Return precautions were given for worsening or concerns. This was discussed with the patient  at the bedside and he stated both understanding and agreement with this plan.     Final Clinical Impressions(s) / ED Diagnoses   Final diagnoses:  Other chest pain  Essential hypertension    New Prescriptions Discharge Medication List as of 12/14/2015 10:58 PM       Francoise Ceo, DO 12/15/15 1256    Geoffery Lyons, MD 12/15/15 2349

## 2015-12-16 ENCOUNTER — Other Ambulatory Visit: Payer: Self-pay | Admitting: Cardiovascular Disease

## 2015-12-16 MED ORDER — POTASSIUM CHLORIDE 20 MEQ PO PACK: 20.0000 meq | PACK | Freq: Every day | ORAL | 3 refills | Status: DC

## 2015-12-29 ENCOUNTER — Ambulatory Visit (HOSPITAL_COMMUNITY)
Admission: EM | Admit: 2015-12-29 | Discharge: 2015-12-29 | Disposition: A | Discharge status: Home / Self Care | Payer: BLUE CROSS/BLUE SHIELD | Attending: Family Medicine | Admitting: Family Medicine

## 2015-12-29 ENCOUNTER — Encounter (HOSPITAL_COMMUNITY): Payer: Self-pay | Admitting: Emergency Medicine

## 2015-12-29 DIAGNOSIS — N410 Acute prostatitis: Secondary | ICD-10-CM | POA: Diagnosis not present

## 2015-12-29 LAB — POCT URINALYSIS DIP (DEVICE)
Bilirubin Urine: NEGATIVE
GLUCOSE, UA: NEGATIVE mg/dL
Hgb urine dipstick: NEGATIVE
Ketones, ur: NEGATIVE mg/dL
Leukocytes, UA: NEGATIVE
Nitrite: NEGATIVE
PROTEIN: NEGATIVE mg/dL
SPECIFIC GRAVITY, URINE: 1.01 (ref 1.005–1.030)
UROBILINOGEN UA: 0.2 mg/dL (ref 0.0–1.0)
pH: 7 (ref 5.0–8.0)

## 2015-12-29 MED ORDER — CIPROFLOXACIN HCL 500 MG PO TABS: 500.0000 mg | ORAL_TABLET | Freq: Two times a day (BID) | ORAL | 0 refills | Status: DC

## 2015-12-29 NOTE — ED Triage Notes (Signed)
[facility] with a complaint of lower back pain and urinary frequency. The patient stated that he started having lower back pain around 5 days ago and urinary frequency that started today. The patient reported a hx of recurring prostatitis.

## 2015-12-29 NOTE — Discharge Instructions (Signed)
Drink plenty of fluids stay well-hydrated, take the medication as directed. If you are not feeling better in 2-3 weeks your doctor, call for appointment.

## 2015-12-29 NOTE — ED Provider Notes (Signed)
CSN: [id number]     Arrival date & time 12/29/15  1901 History   First MD Initiated Contact with Patient 12/29/15 2027     Chief Complaint  Patient presents with  . Urinary Frequency  . Back Pain   (Consider location/radiation/quality/duration/timing/severity/associated sxs/prior Treatment) 40 year old male states that he has been having some lower back pain described as burning for 4 days. Denies any known injury or movements that produced the pain. It is not worse with moving or bending or pulling or pushing. He states that today his body did not feel right. He felt like he was heating up. He states he has some urinary frequency and nocturia. Denies urine flow or small volume voids. States he has a normal stream. He has a history of prostatitis and this feels the same way as it did in the past.      Past Medical History:  Diagnosis Date  . Abdominal pain   . Chest pain   . Heart palpitations   . Hiatal hernia   . Hypertension   . Plantar fasciitis   . Prostatitis   . Tachycardia    Past Surgical History:  Procedure Laterality Date  . CARDIAC CATHETERIZATION  1993   [place]   Family History  Problem Relation Age of Onset  . Hypertension Father   . Diabetes Mother    Social History  Substance Use Topics  . Smoking status: Never Smoker  . Smokeless tobacco: Never Used  . Alcohol use No    Review of Systems  Constitutional: Negative.   HENT: Negative.   Respiratory: Negative.   Gastrointestinal: Negative.   Genitourinary: Negative for discharge, dysuria, flank pain, hematuria, penile swelling, scrotal swelling, testicular pain and urgency.  Musculoskeletal: Negative.   Skin: Negative.   All other systems reviewed and are negative.   Allergies  Toprol xl [metoprolol succinate]  Home Medications   Prior to Admission medications   Medication Sig Start Date End Date Taking? Authorizing Provider  hydrochlorothiazide  (HYDRODIURIL) 25 MG tablet Take 1 tablet (25 mg total) by mouth daily. 12/14/15  Yes Vesta MixerPhilip J Nahser, MD  metoprolol (LOPRESSOR) 50 MG tablet Take 1 tablet (50 mg total) by mouth 2 (two) times daily. 12/14/15  Yes Vesta MixerPhilip J Nahser, MD  potassium chloride (KLOR-CON) 20 MEQ packet Take 20 mEq by mouth daily. 12/16/15  Yes Vesta MixerPhilip J Nahser, MD  acetaminophen (TYLENOL) 500 MG tablet Take 500 mg by mouth every 6 (six) hours as needed for mild pain. For pain     Historical Provider, MD  ciprofloxacin (CIPRO) 500 MG tablet Take 1 tablet (500 mg total) by mouth 2 (two) times daily. 12/29/15   Hayden Rasmussenavid Tierany Appleby, NP   Meds Ordered and Administered this Visit  Medications - No data to display  BP 143/87 (BP Location: Left Arm)   Pulse 87   Temp 98.2 F (36.8 C) (Oral)   Resp 18   SpO2 99%  No data found.   Physical Exam  Constitutional: He is oriented to person, place, and time. He appears well-developed and well-nourished. No distress.  Eyes: EOM are normal.  Neck: Normal range of motion. Neck supple.  Cardiovascular: Normal rate.   Pulmonary/Chest: Effort normal. No respiratory distress.  Genitourinary:  Genitourinary Comments: DRE: Normal sphincter tone. Prostate firm and tender. Not boggy.  Musculoskeletal: He exhibits no edema.  Neurological: He is alert and oriented to person, place, and time. He exhibits normal muscle tone.  Skin: Skin is warm  and dry.  Psychiatric: He has a normal mood and affect.  Nursing note and vitals reviewed.   Urgent Care Course   Clinical Course     Procedures (including critical care time)  Labs Review Labs Reviewed  POCT URINALYSIS DIP (DEVICE)    Imaging Review No results found.   Visual Acuity Review  Right Eye Distance:   Left Eye Distance:   Bilateral Distance:    Right Eye Near:   Left Eye Near:    Bilateral Near:         MDM   1. Acute prostatitis    Drink plenty of fluids stay well-hydrated, take the medication as directed. If  you are not feeling better in 2-3 weeks your doctor, call for appointment. Meds ordered this encounter  Medications  . ciprofloxacin (CIPRO) 500 MG tablet    Sig: Take 1 tablet (500 mg total) by mouth 2 (two) times daily.    Dispense:  42 tablet    Refill:  0    Order Specific Question:   Supervising Provider    Answer:   Linna HoffKINDL, JAMES D [[id number]]       Hayden Rasmussenavid Raygen Linquist, NP 12/29/15 2040

## 2016-01-19 ENCOUNTER — Other Ambulatory Visit: Payer: BLUE CROSS/BLUE SHIELD

## 2016-03-22 ENCOUNTER — Ambulatory Visit: Payer: BLUE CROSS/BLUE SHIELD | Admitting: Cardiovascular Disease

## 2016-10-24 ENCOUNTER — Emergency Department (HOSPITAL_COMMUNITY)
Admission: EM | Admit: 2016-10-24 | Discharge: 2016-10-24 | Disposition: A | Discharge status: Home / Self Care | Payer: BLUE CROSS/BLUE SHIELD | Attending: Emergency Medicine | Admitting: Emergency Medicine

## 2016-10-24 DIAGNOSIS — I10 Essential (primary) hypertension: Secondary | ICD-10-CM | POA: Diagnosis not present

## 2016-10-24 DIAGNOSIS — R110 Nausea: Secondary | ICD-10-CM | POA: Insufficient documentation

## 2016-10-24 DIAGNOSIS — R42 Dizziness and giddiness: Secondary | ICD-10-CM | POA: Diagnosis not present

## 2016-10-24 DIAGNOSIS — Z79899 Other long term (current) drug therapy: Secondary | ICD-10-CM | POA: Diagnosis not present

## 2016-10-24 LAB — I-STAT CHEM 8, ED
BUN: 7 mg/dL (ref 6–20)
Calcium, Ion: 1.18 mmol/L (ref 1.15–1.40)
Chloride: 101 mmol/L (ref 101–111)
Creatinine, Ser: 0.9 mg/dL (ref 0.61–1.24)
Glucose, Bld: 114 mg/dL — ABNORMAL HIGH (ref 65–99)
HEMATOCRIT: 48 % (ref 39.0–52.0)
HEMOGLOBIN: 16.3 g/dL (ref 13.0–17.0)
POTASSIUM: 3.8 mmol/L (ref 3.5–5.1)
Sodium: 139 mmol/L (ref 135–145)
TCO2: 27 mmol/L (ref 22–32)

## 2016-10-24 MED ORDER — ONDANSETRON 4 MG PO TBDP: 4.0000 mg | ORAL_TABLET | Freq: Three times a day (TID) | ORAL | 0 refills | Status: DC | PRN

## 2016-10-24 MED ORDER — ONDANSETRON HCL 4 MG/2ML IJ SOLN
4.0000 mg | Freq: Once | INTRAMUSCULAR | Status: AC
  Administered 2016-10-24: 4 mg via INTRAVENOUS
  Filled 2016-10-24: qty 2

## 2016-10-24 NOTE — ED Provider Notes (Signed)
[facility] Provider Note   CSN: [id number] Arrival date & time: 10/24/16  4098     History   Chief Complaint Chief Complaint  Patient presents with  . Nausea  . Dizziness    HPI Christopher Allison is a 41 y.o. male.  HPI Patient presents to the emergency room for evaluation of nausea and lightheadedness. Patient states he's been on some antibiotics recently for prostatitis. This morning when he woke up he felt like his mouth was dry and is feeling slightly nauseated and a little lightheaded. He was concerned and thought he should get checked out. Denied room spinning sensation. Denies any trouble with headache or shortness of breath. No numbness or weakness. No difficulty with his speech. Past Medical History:  Diagnosis Date  . Abdominal pain   . Chest pain   . Heart palpitations   . Hiatal hernia   . Hypertension   . Plantar fasciitis   . Prostatitis   . Tachycardia     Patient Active Problem List   Diagnosis Date Noted  . Sinus tachycardia 12/17/2013  . Hypertension 12/03/2010    Past Surgical History:  Procedure Laterality Date  . CARDIAC CATHETERIZATION  1993   Normal heart catheterization in [place], [place]       Home Medications    Prior to Admission medications   Medication Sig Start Date End Date Taking? Authorizing Provider  acetaminophen (TYLENOL) 500 MG tablet Take 500 mg by mouth every 6 (six) hours as needed for mild pain. For pain     [provider]  ciprofloxacin (CIPRO) 500 MG tablet Take 1 tablet (500 mg total) by mouth 2 (two) times daily. 12/29/15   Hayden Rasmussen, NP  hydrochlorothiazide (HYDRODIURIL) 25 MG tablet Take 1 tablet (25 mg total) by mouth daily. 12/14/15   Nahser, Deloris Ping, MD  metoprolol (LOPRESSOR) 50 MG tablet Take 1 tablet (50 mg total) by mouth 2 (two) times daily. 12/14/15   Nahser, Deloris Ping, MD  ondansetron (ZOFRAN-ODT) 4 MG disintegrating tablet Take 1 tablet (4 mg total) by mouth every 8 (eight) hours as  needed for nausea or vomiting. 10/24/16   Linwood Dibbles, MD  potassium chloride (KLOR-CON) 20 MEQ packet Take 20 mEq by mouth daily. 12/16/15   Nahser, Deloris Ping, MD    Family History Family History  Problem Relation Age of Onset  . Hypertension Father   . Diabetes Mother     Social History Social History  Substance Use Topics  . Smoking status: Never Smoker  . Smokeless tobacco: Never Used  . Alcohol use No     Allergies   Toprol xl [metoprolol succinate]   Review of Systems Review of Systems  All other systems reviewed and are negative.    Physical Exam Updated Vital Signs BP 124/78 (BP Location: Left Arm)   Pulse 69   Temp 97.6 F (36.4 C) (Oral)   Resp 14   Ht 1.753 m (5\' 9" )   Wt 96.2 kg (212 lb)   SpO2 99%   BMI 31.31 kg/m   Physical Exam  Constitutional: He appears well-developed and well-nourished. No distress.  HENT:  Head: Normocephalic and atraumatic.  Right Ear: External ear normal.  Left Ear: External ear normal.  Eyes: Conjunctivae are normal. Right eye exhibits no discharge. Left eye exhibits no discharge. No scleral icterus.  Neck: Neck supple. No tracheal deviation present.  Cardiovascular: Normal rate, regular rhythm and intact distal pulses.   Pulmonary/Chest: Effort normal and breath sounds normal.  No stridor. No respiratory distress. He has no wheezes. He has no rales.  Abdominal: Soft. Bowel sounds are normal. He exhibits no distension. There is no tenderness. There is no rebound and no guarding.  Musculoskeletal: He exhibits no edema or tenderness.  Neurological: He is alert. He has normal strength. No cranial nerve deficit (no facial droop, extraocular movements intact, no slurred speech) or sensory deficit. He exhibits normal muscle tone. He displays no seizure activity. Coordination normal.  Skin: Skin is warm and dry. No rash noted.  Psychiatric: He has a normal mood and affect.  Nursing note and vitals reviewed.    ED Treatments /  Results  Labs (all labs ordered are listed, but only abnormal results are displayed) Labs Reviewed  I-STAT CHEM 8, ED - Abnormal; Notable for the following:       Result Value   Glucose, Bld 114 (*)    All other components within normal limits    EKG  EKG Interpretation  Date/Time:  Monday October 24 2016 09:40:22 EDT Ventricular Rate:  65 PR Interval:    QRS Duration: 87 QT Interval:  417 QTC Calculation: 434 R Axis:   -8 Text Interpretation:  Sinus rhythm Probable left atrial enlargement Low voltage, precordial leads Probable anteroseptal infarct, old No significant change since last tracing Confirmed by Linwood DibblesKnapp, Shaylen Nephew [phone]([id number]) on 10/24/2016 9:44:53 AM       Radiology No results found.  Procedures Procedures (including critical care time)  Medications Ordered in ED Medications  ondansetron (ZOFRAN) injection 4 mg (4 mg Intravenous Given 10/24/16 1145)     Initial Impression / Assessment and Plan / ED Course  I have reviewed the triage vital signs and the nursing notes.  Pertinent labs & imaging results that were available during my care of the patient were reviewed by me and considered in my medical decision making (see chart for details).   patient presented to the emergency room with complaints of mild nausea. No abdominal pain. No vertigo.  Exam is benign.  Patient tolerated liquids in the emergency room. Laboratory tests are reassuring. Stable for discharge with a prescription for Zofran  Final Clinical Impressions(s) / ED Diagnoses   Final diagnoses:  Nausea    New Prescriptions New Prescriptions   ONDANSETRON (ZOFRAN-ODT) 4 MG DISINTEGRATING TABLET    Take 1 tablet (4 mg total) by mouth every 8 (eight) hours as needed for nausea or vomiting.     Linwood DibblesKnapp, Demani Mcbrien, MD 10/24/16 1239

## 2016-10-24 NOTE — Discharge Instructions (Signed)
Continue your current medications, follow up with your primary care doctor, return as needed for worsening symptoms °

## 2016-12-19 ENCOUNTER — Other Ambulatory Visit: Payer: Self-pay | Admitting: *Deleted

## 2016-12-19 MED ORDER — METOPROLOL TARTRATE 50 MG PO TABS: 50.0000 mg | ORAL_TABLET | Freq: Two times a day (BID) | ORAL | 0 refills | Status: DC

## 2017-03-03 ENCOUNTER — Encounter: Payer: Self-pay | Admitting: Cardiovascular Disease

## 2017-03-03 ENCOUNTER — Ambulatory Visit: Payer: BLUE CROSS/BLUE SHIELD | Admitting: Cardiovascular Disease

## 2017-03-03 VITALS — BP 144/86 | HR 80 | Ht 69.0 in | Wt 220.8 lb

## 2017-03-03 DIAGNOSIS — I10 Essential (primary) hypertension: Secondary | ICD-10-CM

## 2017-03-03 LAB — BASIC METABOLIC PANEL
BUN / CREAT RATIO: 15 (ref 9–20)
BUN: 14 mg/dL (ref 6–24)
CO2: 26 mmol/L (ref 20–29)
Calcium: 9.7 mg/dL (ref 8.7–10.2)
Chloride: 96 mmol/L (ref 96–106)
Creatinine, Ser: 0.95 mg/dL (ref 0.76–1.27)
GFR calc Af Amer: 112 mL/min/{1.73_m2} (ref >59)
GFR, EST NON AFRICAN AMERICAN: 97 mL/min/{1.73_m2} (ref >59)
Glucose: 119 mg/dL — ABNORMAL HIGH (ref 65–99)
POTASSIUM: 4.1 mmol/L (ref 3.5–5.2)
SODIUM: 137 mmol/L (ref 134–144)

## 2017-03-03 MED ORDER — HYDROCHLOROTHIAZIDE 25 MG PO TABS: 25.0000 mg | ORAL_TABLET | Freq: Every day | ORAL | 3 refills | Status: DC

## 2017-03-03 NOTE — Patient Instructions (Signed)
Medication Instructions:  Your physician recommends that you continue on your current medications as directed. Please refer to the Current Medication list given to you today.   Labwork: TODAY - basic metabolic panel   Testing/Procedures: None Ordered   Follow-Up: Your physician wants you to follow-up in: 1 year with Dr. Nahser. You will receive a reminder letter in the mail two months in advance. If you don't receive a letter, please call our office to schedule the follow-up appointment.   If you need a refill on your cardiac medications before your next appointment, please call your pharmacy.   Thank you for choosing [facility]! Michelle Swinyer, RN [phone]    

## 2017-03-03 NOTE — Progress Notes (Signed)
Christopher Allison Date of Birth  12/18/1975       [facility] Office [address]  [address] [place], [place]  [address]   [place], [place]  [address] [phone]     [phone]   Fax  [phone]    Fax [phone]  Problem List: 1. Tachycardia 2. Hypertension  Previous notes.Christopher Allison is a 42 yo with HTN.  He's done well since I last saw him a year ago. He's not exercising as much as he would like and has gained some weight. He also has been eating some salty foods. He denies any episodes of chest pain or shortness of breath.  Sept. 22, 2014:  Christopher Allison is doing well.  No complaints.  He is still working at [employer].  Oct. 27, 2015:  Christopher Allison is doing ok.  Still working at [employer]. 5 PM to 10 PM.   Still eating fast foods.   Eats at [facility] frequently.   Oct. 27, 2016:  Doing well.  Still at [employer].  Oct. 23, 2017:  Christopher Allison is seen for his 1 year office visit . Ran out of his HCTZ  several months ago Also is eating lots of salty foods.   March 03, 2016: Is doing well.   Had some prostate irritation Still eating hot dogs  Still  No CP or dyspnea Still at [employer]  Not exercising much    Current Outpatient Medications on File Prior to Visit  Medication Sig Dispense Refill  . acetaminophen (TYLENOL) 500 MG tablet Take 500 mg by mouth every 6 (six) hours as needed for mild pain. For pain     . hydrochlorothiazide (HYDRODIURIL) 25 MG tablet Take 1 tablet (25 mg total) by mouth daily. 90 tablet 3  . metoprolol tartrate (LOPRESSOR) 50 MG tablet Take 1 tablet (50 mg total) by mouth 2 (two) times daily. Patient must keep 03/03/17 appointment for further refills 180 tablet 0  . potassium chloride (KLOR-CON) 20 MEQ packet Take 20 mEq by mouth daily. 90 tablet 3   No current facility-administered medications on file prior to visit.     Allergies  Allergen Reactions  . Toprol Xl [Metoprolol Succinate] Hives and Itching    Past Medical  History:  Diagnosis Date  . Abdominal pain   . Chest pain   . Heart palpitations   . Hiatal hernia   . Hypertension   . Plantar fasciitis   . Prostatitis   . Tachycardia     Past Surgical History:  Procedure Laterality Date  . CARDIAC CATHETERIZATION  1993   Normal heart catheterization in [place], [place]    Social History   Tobacco Use  Smoking Status Never Smoker  Smokeless Tobacco Never Used    Social History   Substance and Sexual Activity  Alcohol Use No    Family History  Problem Relation Age of Onset  . Hypertension Father   . Diabetes Mother     Reviw of Systems:  Reviewed in the HPI.  All other systems are negative.  Physical Exam: Blood pressure (!) 144/86, pulse 80, height 5\' 9"  (1.753 m), weight 220 lb 12.8 oz (100.2 kg), SpO2 98 %.  GEN:  Well nourished, well developed in no acute distress HEENT: Normal NECK: No JVD; No carotid bruits LYMPHATICS: No lymphadenopathy CARDIAC: RR, no murmurs RESPIRATORY:  Clear to auscultation without rales, wheezing or rhonchi  ABDOMEN: Soft, non-tender, non-distended MUSCULOSKELETAL:  No edema;  No deformity  SKIN: Warm and dry NEUROLOGIC:  Alert and oriented x 3   ECG:   Assessment / Plan:    1. Hypertension: Christopher Loaim  still eats quite a bit of salt.  He he eats hotdogs on a regular basis.  He is also gained a bit of weight.  I have advised him to work on his salt intake and to work on a better exercise program.  Continue current medications.  We will check a basic metabolic profile today.   Kristeen MissPhilip Wylene Weissman, MD  03/03/2017 11:26 AM    [facility] [address],  [address] [place], [place]  [id number] Pager [phone] Phone: [phone]; Fax: [phone]

## 2017-03-07 ENCOUNTER — Encounter: Payer: Self-pay | Admitting: Nurse Practitioner

## 2017-03-21 ENCOUNTER — Other Ambulatory Visit: Payer: Self-pay | Admitting: *Deleted

## 2017-03-21 MED ORDER — METOPROLOL TARTRATE 50 MG PO TABS: 50.0000 mg | ORAL_TABLET | Freq: Two times a day (BID) | ORAL | 3 refills | Status: DC

## 2018-03-10 ENCOUNTER — Other Ambulatory Visit: Payer: Self-pay | Admitting: Cardiovascular Disease

## 2018-04-08 ENCOUNTER — Other Ambulatory Visit: Payer: Self-pay | Admitting: Cardiovascular Disease

## 2018-04-17 ENCOUNTER — Encounter (INDEPENDENT_AMBULATORY_CARE_PROVIDER_SITE_OTHER): Payer: Self-pay

## 2018-04-17 ENCOUNTER — Encounter: Payer: Self-pay | Admitting: Cardiovascular Disease

## 2018-04-17 ENCOUNTER — Ambulatory Visit (INDEPENDENT_AMBULATORY_CARE_PROVIDER_SITE_OTHER): Payer: BLUE CROSS/BLUE SHIELD | Admitting: Cardiovascular Disease

## 2018-04-17 VITALS — BP 146/82 | HR 82 | Ht 69.0 in | Wt 222.1 lb

## 2018-04-17 DIAGNOSIS — I10 Essential (primary) hypertension: Secondary | ICD-10-CM | POA: Diagnosis not present

## 2018-04-17 MED ORDER — POTASSIUM CHLORIDE 20 MEQ PO PACK: 20.0000 meq | PACK | Freq: Every day | ORAL | 3 refills | Status: DC

## 2018-04-17 MED ORDER — METOPROLOL TARTRATE 50 MG PO TABS: 50.0000 mg | ORAL_TABLET | Freq: Two times a day (BID) | ORAL | 3 refills | Status: DC

## 2018-04-17 NOTE — Patient Instructions (Signed)
Medication Instructions:  Your physician has recommended you make the following change in your medication:  INCREASE Metoprolol (Lopressor) to 50 mg twice daily  If you need a refill on your cardiac medications before your next appointment, please call your pharmacy.    Lab work: TODAY - basic metabolic panel  If you have labs (blood work) drawn today and your tests are completely normal, you will receive your results only by: Marland Kitchen MyChart Message (if you have MyChart) OR . A paper copy in the mail If you have any lab test that is abnormal or we need to change your treatment, we will call you to review the results.   Testing/Procedures: None Ordered   Follow-Up: At [facility], you and your health needs are our priority.  As part of our continuing mission to provide you with exceptional heart care, we have created designated Provider Care Teams.  These Care Teams include your primary Cardiologist (physician) and Advanced Practice Providers (APPs -  Physician Assistants and Nurse Practitioners) who all work together to provide you with the care you need, when you need it. You will need a follow up appointment in:  3 months.  You may see Kristeen Miss, MD or one of the following Advanced Practice Providers on your designated Care Team: Tereso Newcomer, PA-C [place], [place] . Berton Bon, NP

## 2018-04-17 NOTE — Progress Notes (Signed)
Christopher Allison Date of Birth  [birth year]       [facility] Office [address]  [address], [address] [place], [place]  [address]   [place], [place]  [address] [phone]     [phone]   Fax  [phone]    Fax [phone]  Problem List: 1. Tachycardia 2. Hypertension  Previous notes.Christopher Allison is a 43 yo with HTN.  He's done well since I last saw him a year ago. He's not exercising as much as he would like and has gained some weight. He also has been eating some salty foods. He denies any episodes of chest pain or shortness of breath.  Sept. 22, 2014:  Christopher Allison is doing well.  No complaints.  He is still working at [employer].  Oct. 27, 2015:  Christopher Allison is doing ok.  Still working at [employer]. 5 PM to 10 PM.   Still eating fast foods.   Eats at [facility] frequently.   Oct. 27, 2016:  Doing well.  Still at [employer].  Oct. 23, 2017:  Christopher Allison is seen for his 1 year office visit . Ran out of his HCTZ  several months ago Also is eating lots of salty foods.   March 03, 2016: Is doing well.   Had some prostate irritation Still eating hot dogs  Still  No CP or dyspnea Still at [employer]  Not exercising much   April 17, 2018: Christopher Allison is seen today for follow-up visit. Doing well Wt is 222 lbs Still at [employer]  No CP or dyspnea  Still eating hot dogs.    Current Outpatient Medications on File Prior to Visit  Medication Sig Dispense Refill  . acetaminophen (TYLENOL) 500 MG tablet Take 500 mg by mouth every 6 (six) hours as needed for mild pain. For pain     . hydrochlorothiazide (HYDRODIURIL) 25 MG tablet TAKE 1 TABLET BY MOUTH EVERY DAY 30 tablet 0  . metoprolol tartrate (LOPRESSOR) 50 MG tablet TAKE 1 TABLET BY MOUTH TWICE A DAY 60 tablet 0  . potassium chloride (KLOR-CON) 20 MEQ packet Take 20 mEq by mouth daily. 90 tablet 3   No current facility-administered medications on file prior to visit.     Allergies  Allergen Reactions  . Toprol Xl  [Metoprolol Succinate] Hives and Itching    Past Medical History:  Diagnosis Date  . Abdominal pain   . Chest pain   . Heart palpitations   . Hiatal hernia   . Hypertension   . Plantar fasciitis   . Prostatitis   . Tachycardia     Past Surgical History:  Procedure Laterality Date  . CARDIAC CATHETERIZATION  1993   Normal heart catheterization in [place], [place]    Social History   Tobacco Use  Smoking Status Never Smoker  Smokeless Tobacco Never Used    Social History   Substance and Sexual Activity  Alcohol Use No    Family History  Problem Relation Age of Onset  . Hypertension Father   . Diabetes Mother     Reviw of Systems:  Reviewed in the HPI.  All other systems are negative.  Physical Exam: Blood pressure (!) 146/82, pulse 82, height 5\' 9"  (1.753 m), weight 222 lb 1.9 oz (100.8 kg), SpO2 97 %.  GEN:  Well nourished, well developed in no acute distress HEENT: Normal NECK: No JVD; No carotid bruits LYMPHATICS: No lymphadenopathy CARDIAC: RRR  , soft systolic  murmur  RESPIRATORY:  Clear to auscultation without rales, wheezing or rhonchi  ABDOMEN: Soft, non-tender, non-distended MUSCULOSKELETAL:  No edema; No deformity  SKIN: Warm and dry NEUROLOGIC:  Alert and oriented x 3     ECG: April 17, 2018: Normal sinus rhythm at 82.  Left axis deviation.   Assessment / Plan:    1. Hypertension:   Tells him still eats out quite a bit of salty food.  He also eats a lot of fast foods and soft drinks.  Have encouraged him to work on a weight loss program.  Continue hydrochlorothiazide and potassium.  We will increase his metoprolol to 50 mg twice a day.  Him return to see my APP in 3 months.  Check a basic metabolic profile today.  2.  Family history of diabetes mellitus: His glucose levels have been mildly elevated in the past.  He has a family history of diabetes.  I have encouraged him to decrease his soft drinks and to cut out his high sugar fruit  juices.   Kristeen Miss, MD  04/17/2018 3:26 PM    [facility] [address] [place],  [address] [place], [place]  [address] Pager [phone] Phone: [phone]; Fax: [phone]

## 2018-04-18 LAB — BASIC METABOLIC PANEL
BUN/Creatinine Ratio: 10 (ref 9–20)
BUN: 11 mg/dL (ref 6–24)
CHLORIDE: 98 mmol/L (ref 96–106)
CO2: 25 mmol/L (ref 20–29)
CREATININE: 1.14 mg/dL (ref 0.76–1.27)
Calcium: 10.2 mg/dL (ref 8.7–10.2)
GFR calc Af Amer: 89 mL/min/{1.73_m2} (ref >59)
GFR calc non Af Amer: 77 mL/min/{1.73_m2} (ref >59)
GLUCOSE: 108 mg/dL — AB (ref 65–99)
Potassium: 3.5 mmol/L (ref 3.5–5.2)
Sodium: 140 mmol/L (ref 134–144)

## 2018-05-05 ENCOUNTER — Other Ambulatory Visit: Payer: Self-pay | Admitting: Cardiovascular Disease

## 2018-07-13 ENCOUNTER — Telehealth: Payer: Self-pay | Admitting: *Deleted

## 2018-07-13 NOTE — Telephone Encounter (Signed)
LVM TO CALL BACK ABOUT APPT  

## 2018-07-16 NOTE — Progress Notes (Deleted)
{Choose 1 Note Type (Telehealth Visit or Telephone Visit):[phone]}   Date:  07/16/2018   ID:  Christopher Allison, DOB 1976-01-23, MRN [id number]  {Patient Location:[phone]::"Home"} {Provider [phone]::"Home"}  PCP:  Ronnald Nian, MD  Cardiologist:  Kristeen Miss, MD *** Electrophysiologist:  None   Evaluation Performed:  {Choose Visit [phone]::"Follow-Up Visit"}  Chief Complaint:  ***  History of Present Illness:    Christopher Allison is a 43 y.o. male with hypertension.  He was last seen by Dr. Elease Hashimoto in Feb 2020.  His Metoprolol was adjusted at that time due to uncontrolled blood pressure.  ***  The patient {does/does not:[phone]} have symptoms concerning for COVID-19 infection (fever, chills, cough, or new shortness of breath).    Past Medical History:  Diagnosis Date  . Abdominal pain   . Chest pain   . Heart palpitations   . Hiatal hernia   . Hypertension   . Plantar fasciitis   . Prostatitis   . Tachycardia    Past Surgical History:  Procedure Laterality Date  . CARDIAC CATHETERIZATION  1993   Normal heart catheterization in [place], [place]     No outpatient medications have been marked as taking for the 07/17/18 encounter (Appointment) with Tereso Newcomer T, PA-C.     Allergies:   Toprol xl [metoprolol succinate]   Social History   Tobacco Use  . Smoking status: Never Smoker  . Smokeless tobacco: Never Used  Substance Use Topics  . Alcohol use: No  . Drug use: No     Family Hx: The patient's family history includes Diabetes in his mother; Hypertension in his father.  ROS:   Please see the history of present illness.    *** All other systems reviewed and are negative.   Prior CV studies:   The following studies were reviewed today:  *** Echo 06/04/2008 EF 55-60, trace MR, trace TR   Labs/Other Tests and Data Reviewed:    EKG:  {EKG/Telemetry Strips [phone]}  Recent Labs: 04/17/2018: BUN 11; Creatinine, Ser  1.14; Potassium 3.5; Sodium 140   Recent Lipid Panel Lab Results  Component Value Date/Time   CHOL 202 (H) 12/18/2014 03:01 PM   TRIG 102 12/18/2014 03:01 PM   HDL 40 12/18/2014 03:01 PM   CHOLHDL 5.1 (H) 12/18/2014 03:01 PM   LDLCALC 142 (H) 12/18/2014 03:01 PM    Wt Readings from Last 3 Encounters:  04/17/18 222 lb 1.9 oz (100.8 kg)  03/03/17 220 lb 12.8 oz (100.2 kg)  10/24/16 212 lb (96.2 kg)     Objective:    Vital Signs:  There were no vitals taken for this visit.   {HeartCare Virtual Exam (Optional):[phone]::"VITAL SIGNS:  reviewed"}  ASSESSMENT & PLAN:    *** Essential hypertension  Educated About Covid-19 Virus Infection    COVID-19 Education: The signs and symptoms of COVID-19 were discussed with the patient and how to seek care for testing (follow up with PCP or arrange E-visit).  ***The importance of social distancing was discussed today.  Time:   Today, I have spent *** minutes with the patient with telehealth technology discussing the above problems.     Medication Adjustments/Labs and Tests Ordered: Current medicines are reviewed at length with the patient today.  Concerns regarding medicines are outlined above.   Tests Ordered: No orders of the defined types were placed in this encounter.   Medication Changes: No orders of the defined types were placed in this encounter.   Disposition:  Follow up {follow up:15908}  Signed, Tereso NewcomerScott Fraya Ueda, PA-C  07/16/2018 9:21 PM    [facility]

## 2018-07-17 ENCOUNTER — Other Ambulatory Visit: Payer: Self-pay

## 2018-07-17 ENCOUNTER — Telehealth: Payer: Self-pay | Admitting: *Deleted

## 2018-07-17 ENCOUNTER — Ambulatory Visit: Payer: BLUE CROSS/BLUE SHIELD | Admitting: Physician Assistant

## 2018-07-17 NOTE — Telephone Encounter (Signed)
Virtual Visit Pre-Appointment Phone Call  "(Name), I am calling you today to discuss your upcoming appointment. We are currently trying to limit exposure to the virus that causes COVID-19 by seeing patients at home rather than in the office."  1. "What is the BEST phone number to call the day of the visit?" - include this in appointment notes  2. "Do you have or have access to (through a family member/friend) a smartphone with video capability that we can use for your visit?" a. If yes - list this number in appt notes as "cell" (if different from BEST phone #) and list the appointment type as a VIDEO visit in appointment notes b. If no - list the appointment type as a PHONE visit in appointment notes  3. Confirm consent - "In the setting of the current Covid19 crisis, you are scheduled for a (phone or video) visit with your provider on (date) at (time).  Just as we do with many in-office visits, in order for you to participate in this visit, we must obtain consent.  If you'd like, I can send this to your mychart (if signed up) or email for you to review.  Otherwise, I can obtain your verbal consent now.  All virtual visits are billed to your insurance company just like a normal visit would be.  By agreeing to a virtual visit, we'd like you to understand that the technology does not allow for your provider to perform an examination, and thus may limit your provider's ability to fully assess your condition. If your provider identifies any concerns that need to be evaluated in person, we will make arrangements to do so.  Finally, though the technology is pretty good, we cannot assure that it will always work on either your or our end, and in the setting of a video visit, we may have to convert it to a phone-only visit.  In either situation, we cannot ensure that we have a secure connection.  Are you willing to proceed?" STAFF: Did the patient verbally acknowledge consent to telehealth visit? Document  YES/NO here: YES  4. Advise patient to be prepared - "Two hours prior to your appointment, go ahead and check your blood pressure, pulse, oxygen saturation, and your weight (if you have the equipment to check those) and write them all down. When your visit starts, your provider will ask you for this information. If you have an Apple Watch or Kardia device, please plan to have heart rate information ready on the day of your appointment. Please have a pen and paper handy nearby the day of the visit as well."  5. Give patient instructions for MyChart download to smartphone OR Doximity/Doxy.me as below if video visit (depending on what platform provider is using)  6. Inform patient they will receive a phone call 15 minutes prior to their appointment time (may be from unknown caller ID) so they should be prepared to answer    TELEPHONE CALL NOTE  Kota L Duquette has been deemed a candidate for a follow-up tele-health visit to limit community exposure during the Covid-19 pandemic. I spoke with the patient via phone to ensure availability of phone/video source, confirm preferred email & phone number, and discuss instructions and expectations.  I reminded Andie L Huge to be prepared with any vital sign and/or heart rhythm information that could potentially be obtained via home monitoring, at the time of his visit. I reminded Hilliard Clarkimothy L Krauter to expect a phone call prior to  his visit.  Bellamy Judson, CMA 07/17/2018 11:55 AM   INSTRUCTIONS FOR DOWNLOADING THE MYCHART APP TO SMARTPHONE  - The patient must first make sure to have activated MyChart and know their login information - If Apple, go to [employer] and type in MyChart in the search bar and download the app. If Android, ask patient to go to [employer] and type in [place] in the search bar and download the app. The app is free but as with any other app downloads, their phone may require them to verify saved payment information or  Apple/Android password.  - The patient will need to then log into the app with their MyChart username and password, and select [facility] as their healthcare provider to link the account. When it is time for your visit, go to the MyChart app, find appointments, and click Begin Video Visit. Be sure to Select Allow for your device to access the Microphone and Camera for your visit. You will then be connected, and your provider will be with you shortly.  **If they have any issues connecting, or need assistance please contact MyChart service desk [phone])**  **If using a computer, in order to ensure the best quality for their visit they will need to use either of the following Internet Browsers: [employer], or Google Chrome**  IF USING DOXIMITY or DOXY.ME - The patient will receive a link just prior to their visit by text.     FULL LENGTH CONSENT FOR TELE-HEALTH VISIT   I hereby voluntarily request, consent and authorize [facility] and its employed or contracted physicians, physician assistants, nurse practitioners or other licensed health care professionals (the Practitioner), to provide me with telemedicine health care services (the "Services") as deemed necessary by the treating Practitioner. I acknowledge and consent to receive the Services by the Practitioner via telemedicine. I understand that the telemedicine visit will involve communicating with the Practitioner through live audiovisual communication technology and the disclosure of certain medical information by electronic transmission. I acknowledge that I have been given the opportunity to request an in-person assessment or other available alternative prior to the telemedicine visit and am voluntarily participating in the telemedicine visit.  I understand that I have the right to withhold or withdraw my consent to the use of telemedicine in the course of my care at any time, without affecting my right to future care  or treatment, and that the Practitioner or I may terminate the telemedicine visit at any time. I understand that I have the right to inspect all information obtained and/or recorded in the course of the telemedicine visit and may receive copies of available information for a reasonable fee.  I understand that some of the potential risks of receiving the Services via telemedicine include:  Marland Kitchen Delay or interruption in medical evaluation due to technological equipment failure or disruption; . Information transmitted may not be sufficient (e.g. poor resolution of images) to allow for appropriate medical decision making by the Practitioner; and/or  . In rare instances, security protocols could fail, causing a breach of personal health information.  Furthermore, I acknowledge that it is my responsibility to provide information about my medical history, conditions and care that is complete and accurate to the best of my ability. I acknowledge that Practitioner's advice, recommendations, and/or decision may be based on factors not within their control, such as incomplete or inaccurate data provided by me or distortions of diagnostic images or specimens that may result from electronic transmissions. I understand  that the practice of medicine is not an exact science and that Practitioner makes no warranties or guarantees regarding treatment outcomes. I acknowledge that I will receive a copy of this consent concurrently upon execution via email to the email address I last provided but may also request a printed copy by calling the office of [facility].    I understand that my insurance will be billed for this visit.   I have read or had this consent read to me. . I understand the contents of this consent, which adequately explains the benefits and risks of the Services being provided via telemedicine.  . I have been provided ample opportunity to ask questions regarding this consent and the Services and have had  my questions answered to my satisfaction. . I give my informed consent for the services to be provided through the use of telemedicine in my medical care  By participating in this telemedicine visit I agree to the above.

## 2018-07-17 NOTE — Telephone Encounter (Signed)
SPOKE WITH PT AND PT AGREED TO AN IN OFFICE VISIT ON THURS JUN 4 WITH LAURA INGOLD

## 2018-07-24 NOTE — Progress Notes (Signed)
Virtual Visit via Telephone Note   This visit type was conducted due to national recommendations for restrictions regarding the COVID-19 Pandemic (e.g. social distancing) in an effort to limit this patient's exposure and mitigate transmission in our community.  Due to his co-morbid illnesses, this patient is at least at moderate risk for complications without adequate follow up.  This format is felt to be most appropriate for this patient at this time.  The patient did not have access to video technology/had technical difficulties with video requiring transitioning to audio format only (telephone).  All issues noted in this document were discussed and addressed.  No physical exam could be performed with this format.  Please refer to the patient's chart for his  consent to telehealth for [facility].   Unable to do video   Date:  07/26/2018   ID:  Christopher Allison, DOB 07/13/1975, MRN [id number]  Patient Location: Home Provider Location: Home  PCP:  Ronnald NianLalonde, John C, MD  Cardiologist:  Kristeen MissPhilip Nahser, MD  Electrophysiologist:  None   Evaluation Performed:  Follow-Up Visit  Chief Complaint:  HTN  History of Present Illness:    Christopher Allison is a 43 y.o. male with HTN  Pt could not take BP so this visit was stopped and pt will be seen in office. Tomorrow.   The patient does not have symptoms concerning for COVID-19 infection (fever, chills, cough, or new shortness of breath).    Past Medical History:  Diagnosis Date  . Abdominal pain   . Chest pain   . Heart palpitations   . Hiatal hernia   . Hypertension   . Plantar fasciitis   . Prostatitis   . Tachycardia    Past Surgical History:  Procedure Laterality Date  . CARDIAC CATHETERIZATION  1993   [place]     Current Meds  Medication Sig  . acetaminophen (TYLENOL) 500 MG tablet Take 500 mg by mouth every 6 (six) hours as needed for mild pain. For pain   . hydrochlorothiazide  (HYDRODIURIL) 25 MG tablet TAKE 1 TABLET BY MOUTH EVERY DAY  . metoprolol tartrate (LOPRESSOR) 50 MG tablet Take 1 tablet (50 mg total) by mouth 2 (two) times daily.  . potassium chloride (KLOR-CON) 20 MEQ packet Take 20 mEq by mouth daily.     Allergies:   Toprol xl [metoprolol succinate]   Social History   Tobacco Use  . Smoking status: Never Smoker  . Smokeless tobacco: Never Used  Substance Use Topics  . Alcohol use: No  . Drug use: No     Family Hx: The patient's family history includes Diabetes in his mother; Hypertension in his father.  ROS:   Please see the history of present illness.     All other systems reviewed and are negative.   Prior CV studies:   The following studies were reviewed today:    Labs/Other Tests and Data Reviewed:    EKG:  No ECG reviewed.  Recent Labs: 04/17/2018: BUN 11; Creatinine, Ser 1.14; Potassium 3.5; Sodium 140   Recent Lipid Panel Lab Results  Component Value Date/Time   CHOL 202 (H) 12/18/2014 03:01 PM   TRIG 102 12/18/2014 03:01 PM   HDL 40 12/18/2014 03:01 PM   CHOLHDL 5.1 (H) 12/18/2014 03:01 PM   LDLCALC 142 (H) 12/18/2014 03:01 PM    Wt Readings from Last 3 Encounters:  07/26/18 222 lb (100.7 kg)  04/17/18 222 lb 1.9 oz (  100.8 kg)  03/03/17 220 lb 12.8 oz (100.2 kg)     Objective:    Vital Signs:  Ht 5\' 9"  (1.753 m)   Wt 222 lb (100.7 kg)   BMI 32.78 kg/m    VITAL SIGNS:  reviewed but no BP  ASSESSMENT & PLAN:    1. No charge for visit pt will come in tomorrow.   COVID-19 Education: The signs and symptoms of COVID-19 were discussed with the patient and how to seek care for testing (follow up with PCP or arrange E-visit).  The importance of social distancing was discussed today.  Time:   Today, I have spent 0 minutes with the patient with telehealth technology discussing the above problems.     Medication Adjustments/Labs and Tests Ordered: Current medicines are reviewed at length with the patient  today.  Concerns regarding medicines are outlined above.   Tests Ordered: No orders of the defined types were placed in this encounter.   Medication Changes: No orders of the defined types were placed in this encounter.   Disposition:  Follow up in 1 day(s)  Signed, Nada Boozer, NP  07/26/2018 3:51 PM    [facility]

## 2018-07-26 ENCOUNTER — Ambulatory Visit: Payer: BC Managed Care – PPO | Admitting: Cardiology

## 2018-07-26 ENCOUNTER — Other Ambulatory Visit: Payer: Self-pay

## 2018-07-26 ENCOUNTER — Encounter: Payer: Self-pay | Admitting: Cardiology

## 2018-07-26 ENCOUNTER — Telehealth: Payer: BLUE CROSS/BLUE SHIELD | Admitting: Cardiology

## 2018-07-26 ENCOUNTER — Telehealth: Payer: Self-pay | Admitting: *Deleted

## 2018-07-26 VITALS — Ht 69.0 in | Wt 222.0 lb

## 2018-07-26 DIAGNOSIS — I10 Essential (primary) hypertension: Secondary | ICD-10-CM

## 2018-07-26 NOTE — Telephone Encounter (Signed)
Pt was scheduled for virtual visit, and was supposed to be in office visit.  Pt contacted and o/v scheduled for 07/26/2018.        COVID-19 Pre-Screening Questions:  . In the past 7 to 10 days have you had a cough,  shortness of breath, headache, congestion, fever (100 or greater) body aches, chills, sore throat, or sudden loss of taste or sense of smell? . Have you been around anyone with known Covid 19. . Have you been around anyone who is awaiting Covid 19 test results in the past 7 to 10 days? . Have you been around anyone who has been exposed to Covid 19, or has mentioned symptoms of Covid 19 within the past 7 to 10 days?  If you have any concerns/questions about symptoms patients report during screening (either on the phone or at threshold). Contact the provider seeing the patient or DOD for further guidance.  If neither are available contact a member of the leadership team.

## 2018-07-26 NOTE — Patient Instructions (Signed)
Medication Instructions:  Your physician recommends that you continue on your current medications as directed. Please refer to the Current Medication list given to you today.  If you need a refill on your cardiac medications before your next appointment, please call your pharmacy.   Lab work: None ordered If you have labs (blood work) drawn today and your tests are completely normal, you will receive your results only by: Marland Kitchen MyChart Message (if you have MyChart) OR . A paper copy in the mail If you have any lab test that is abnormal or we need to change your treatment, we will call you to review the results.  Testing/Procedures: None ordered  Follow-Up: COME TO THE OFFICE FOR A IN PERSON OFFICE VISIT, Tuesday, 07/31/2018 AT 2:45.  ARRIVE 15 MINS PRIOR TO YOUR APPOINTMENT TO ALLOW TIME FOR PRESCREEN IN THE LOBBY  Any Other Special Instructions Will Be Listed Below (If Applicable).

## 2018-07-31 ENCOUNTER — Other Ambulatory Visit: Payer: Self-pay

## 2018-07-31 ENCOUNTER — Ambulatory Visit (INDEPENDENT_AMBULATORY_CARE_PROVIDER_SITE_OTHER): Payer: BC Managed Care – PPO | Admitting: Cardiology

## 2018-07-31 ENCOUNTER — Telehealth: Payer: Self-pay | Admitting: Cardiology

## 2018-07-31 ENCOUNTER — Encounter: Payer: Self-pay | Admitting: Cardiology

## 2018-07-31 VITALS — BP 149/93 | HR 89 | Ht 69.0 in | Wt 215.0 lb

## 2018-07-31 DIAGNOSIS — I10 Essential (primary) hypertension: Secondary | ICD-10-CM

## 2018-07-31 MED ORDER — METOPROLOL TARTRATE 100 MG PO TABS: 100.0000 mg | ORAL_TABLET | Freq: Two times a day (BID) | ORAL | 3 refills | Status: DC

## 2018-07-31 NOTE — Patient Instructions (Addendum)
Medication Instructions:  Your physician has recommended you make the following change in your medication:   1. INCREASE METOPROLOL TO 100 MG TWICE DAILY.  If you need a refill on your cardiac medications before your next appointment, please call your pharmacy.   Lab work: NONE If you have labs (blood work) drawn today and your tests are completely normal, you will receive your results only by: Marland Kitchen MyChart Message (if you have MyChart) OR . A paper copy in the mail If you have any lab test that is abnormal or we need to change your treatment, we will call you to review the results.  Testing/Procedures: NONE  Follow-Up: At [facility], you and your health needs are our priority.  As part of our continuing mission to provide you with exceptional heart care, we have created designated Provider Care Teams.  These Care Teams include your primary Cardiologist (physician) and Advanced Practice Providers (APPs -  Physician Assistants and Nurse Practitioners) who all work together to provide you with the care you need, when you need it. . You will need a follow up appointment in:  2 weeks.  Please call our office 2 months in advance to schedule this appointment.  You may see Mertie Moores, MD or Cecilie Kicks, NP   Any Other Special Instructions Will Be Listed Below (If Applicable).

## 2018-07-31 NOTE — Telephone Encounter (Signed)
He had decreased his salt intake.  What is he thinking he took that would have increased?  He needs to be more specific.

## 2018-07-31 NOTE — Telephone Encounter (Signed)
Call placed to pt re: phone message below.  Pt was wondering if Cippro, ABX, could increase his bp. Pt was advised that it shouldn't and to increase his Metoprolol as Mickel Baas had suggested. Pt thanked me for my call.

## 2018-07-31 NOTE — Progress Notes (Signed)
Cardiology Office Note   Date:  07/31/2018   ID:  Christopher Allison, DOB [birth year], MRN [id number]  PCP:  Denita Lung, MD  Cardiologist:  Dr. Acie Fredrickson     Chief Complaint  Patient presents with  . Hypertension      History of Present Illness: Christopher Allison is a 42 y.o. male who presents for HTN on last telehealth I was to see for BP check on med changes.  His BP cuff did not work so visit cancelled by Me and scheduled for today.  In Feb his metoprolol was increased to 50 mg BID  Labs were checked and stable.    Today he has no chest pain or SOB he did stop the hot dogs.he is very active.  Watches his salt more carefully.      Past Medical History:  Diagnosis Date  . Abdominal pain   . Chest pain   . Heart palpitations   . Hiatal hernia   . Hypertension   . Plantar fasciitis   . Prostatitis   . Tachycardia     Past Surgical History:  Procedure Laterality Date  . CARDIAC CATHETERIZATION  1993   Normal heart catheterization in [place], [place]     Current Outpatient Medications  Medication Sig Dispense Refill  . acetaminophen (TYLENOL) 500 MG tablet Take 500 mg by mouth every 6 (six) hours as needed for mild pain. For pain     . hydrochlorothiazide (HYDRODIURIL) 25 MG tablet TAKE 1 TABLET BY MOUTH EVERY DAY 90 tablet 3  . metoprolol tartrate (LOPRESSOR) 50 MG tablet Take 1 tablet (50 mg total) by mouth 2 (two) times daily. 180 tablet 3  . potassium chloride (KLOR-CON) 20 MEQ packet Take 20 mEq by mouth daily. 90 tablet 3   No current facility-administered medications for this visit.     Allergies:   Toprol xl [metoprolol succinate]    Social History:  The patient  reports that he has never smoked. He has never used smokeless tobacco. He reports that he does not drink alcohol or use drugs.   Family History:  The patient's family history includes Diabetes in his mother; Hypertension in his father.    ROS:  General:no colds or fevers, no weight changes  Skin:no rashes or ulcers HEENT:no blurred vision, no congestion CV:see HPI PUL:see HPI Neuro:no syncope, no lightheadedness Endo:no diabetes, no thyroid disease  Wt Readings from Last 3 Encounters:  07/31/18 215 lb (97.5 kg)  07/26/18 222 lb (100.7 kg)  04/17/18 222 lb 1.9 oz (100.8 kg)     PHYSICAL EXAM: VS:  BP (!) 149/93   Pulse 89   Ht 5\' 9"  (1.753 m)   Wt 215 lb (97.5 kg)   SpO2 99%   BMI 31.75 kg/m  , BMI Body mass index is 31.75 kg/m. General:Pleasant affect, NAD Skin:Warm and dry, brisk capillary refill HEENT:normocephalic, sclera clear, mucus membranes moist Neck:supple, no JVD, no bruits  Heart:S1S2 RRR without murmur, gallup, rub or click Lungs:clear without rales, rhonchi, or wheezes MHD:QQIW, non tender, + BS, do not palpate liver spleen or masses Ext:no lower ext edema, 2+ pedal pulses, 2+ radial pulses Neuro:alert and oriented X 3, MAE, follows commands, + facial symmetry    EKG:  EKG is NOT ordered today.    Recent Labs: 04/17/2018: BUN 11; Creatinine, Ser 1.14; Potassium 3.5; Sodium 140    Lipid Panel    Component Value Date/Time   CHOL 202 (H) 12/18/2014 1501   TRIG 102  12/18/2014 1501   HDL 40 12/18/2014 1501   CHOLHDL 5.1 (H) 12/18/2014 1501   VLDL 20 12/18/2014 1501   LDLCALC 142 (H) 12/18/2014 1501       Other studies Reviewed: Additional studies/ records that were reviewed today include: . OV notes reviewed.   ASSESSMENT AND PLAN:  1. HTN will increase BB to 100 mg BID and have virtual phone visit in 2 weeks - he will get new BP cuff by then.   Current medicines are reviewed with the patient today.  The patient Has no concerns regarding medicines.  The following changes have been made:  See above Labs/ tests ordered today include:see above  Disposition:   FU:  see above  Signed, Nada BoozerLaura Ailene Royal, NP  07/31/2018 2:49 PM    [facility] [address], [place], [place]  [address]/ [address] [place], [place] Phone: [phone](336) [phone]; Fax: [phone](336) [phone]  [phone]

## 2018-07-31 NOTE — Telephone Encounter (Signed)
Patient just had virtual visit. He is wondering if something that he took could have affect his BP.

## 2018-08-03 ENCOUNTER — Telehealth: Payer: Self-pay

## 2018-08-03 NOTE — Telephone Encounter (Signed)
Left message for patient to call back regarding his appt on 6/24 with Dr. Acie Fredrickson. Patient will need to be rescheduled for earlier that morning or for another day.

## 2018-08-10 NOTE — Telephone Encounter (Signed)
Spoke to patient and rescheduled appt earlier the same day. Consent already completed.

## 2018-08-15 ENCOUNTER — Telehealth: Payer: BC Managed Care – PPO | Admitting: Cardiovascular Disease

## 2018-08-15 ENCOUNTER — Other Ambulatory Visit: Payer: Self-pay

## 2018-09-01 ENCOUNTER — Other Ambulatory Visit: Payer: Self-pay

## 2018-09-01 ENCOUNTER — Encounter (HOSPITAL_COMMUNITY): Payer: Self-pay

## 2018-09-01 ENCOUNTER — Ambulatory Visit (HOSPITAL_COMMUNITY)
Admission: EM | Admit: 2018-09-01 | Discharge: 2018-09-01 | Disposition: A | Discharge status: Home / Self Care | Payer: BC Managed Care – PPO | Attending: Physician Assistant | Admitting: Physician Assistant

## 2018-09-01 DIAGNOSIS — L509 Urticaria, unspecified: Secondary | ICD-10-CM

## 2018-09-01 MED ORDER — PREDNISONE 50 MG PO TABS: 50.0000 mg | ORAL_TABLET | Freq: Every day | ORAL | 0 refills | Status: DC

## 2018-09-01 NOTE — Discharge Instructions (Signed)
Start prednisone as directed. Allergy medicine for itching such as zyrtec, benadryl. Discontinue metoprolol for now. Contact cardiology on Monday to discuss medication changes. If experience shortness of breath, swelling of the throat, go to the ED for further evaluation needed.

## 2018-09-01 NOTE — ED Triage Notes (Signed)
Pt states he was taking a med Metoprolol and he thinks he had a allergic reaction to the med. Pt started with a rash on his chest.

## 2018-09-01 NOTE — ED Provider Notes (Signed)
[name]    CSN: [id number] Arrival date & time: 09/01/18  1432     History   Chief Complaint Chief Complaint  Patient presents with  . Allergic Reaction    HPI Christopher Allison is a 43 y.o. male.   43 year old male comes in for allergic reaction.  States yesterday started noticing rash diffusely to the chest.  This has happened 1 time before, when he took metoprolol succinate.  He is not taking metoprolol tartrate, and was increased to 100mg  twice a day from 50 mg twice a day about a month ago.  He denies any other changes in medication.  Denies other possible exposures such as new hygiene products, new food, irritant contact.  States when symptoms started for metoprolol succinate, it also did not happen immediately.  He denies any oral swelling, trouble breathing, trouble swallowing, tripoding, drooling, trismus.  He decreased his dosage back to 50 mg yesterday since symptoms started.  He has not taken anything else for the symptoms.  He is on metoprolol primarily for heart palpitations and tachycardia.  He has been on 50 mg twice daily for a few years, and was recently increased to help blood pressure control.     Past Medical History:  Diagnosis Date  . Abdominal pain   . Chest pain   . Heart palpitations   . Hiatal hernia   . Hypertension   . Plantar fasciitis   . Prostatitis   . Tachycardia     Patient Active Problem List   Diagnosis Date Noted  . Sinus tachycardia 12/17/2013  . Hypertension 12/03/2010    Past Surgical History:  Procedure Laterality Date  . CARDIAC CATHETERIZATION  1993   Normal heart catheterization in [place], [place] Medications    Prior to Admission medications   Medication Sig Start Date End Date Taking? Authorizing Provider  acetaminophen (TYLENOL) 500 MG tablet Take 500 mg by mouth every 6 (six) hours as needed for mild pain. For pain     [provider]  hydrochlorothiazide (HYDRODIURIL) 25 MG  tablet TAKE 1 TABLET BY MOUTH EVERY DAY 05/07/18   Nahser, Wonda Cheng, MD  metoprolol tartrate (LOPRESSOR) 100 MG tablet Take 1 tablet (100 mg total) by mouth 2 (two) times daily. 07/31/18 10/29/18  Isaiah Serge, NP  potassium chloride (KLOR-CON) 20 MEQ packet Take 20 mEq by mouth daily. 04/17/18   Nahser, Wonda Cheng, MD  predniSONE (DELTASONE) 50 MG tablet Take 1 tablet (50 mg total) by mouth daily. 09/01/18   Ok Edwards, PA-C    Family History Family History  Problem Relation Age of Onset  . Hypertension Father   . Diabetes Mother     Social History Social History   Tobacco Use  . Smoking status: Never Smoker  . Smokeless tobacco: Never Used  Substance Use Topics  . Alcohol use: No  . Drug use: No     Allergies   Toprol xl [metoprolol succinate]   Review of Systems Review of Systems  Reason unable to perform ROS: See HPI as above.     Physical Exam Triage Vital Signs ED Triage Vitals  Enc Vitals Group     BP 09/01/18 1540 139/86     Pulse Rate 09/01/18 1540 81     Resp 09/01/18 1540 18     Temp 09/01/18 1540 97.8 F (36.6 C)     Temp src --      SpO2 09/01/18 1540 99 %  Weight 09/01/18 1542 215 lb (97.5 kg)     Height --      Head Circumference --      Peak Flow --      Pain Score 09/01/18 1542 0     Pain Loc --      Pain Edu? --      Excl. in GC? --    No data found.  Updated Vital Signs BP 139/86 (BP Location: Right Arm)   Pulse 81   Temp 97.8 F (36.6 C)   Resp 18   Wt 215 lb (97.5 kg)   SpO2 99%   BMI 31.75 kg/m   Physical Exam Constitutional:      General: He is not in acute distress.    Appearance: He is well-developed. He is not ill-appearing, toxic-appearing or diaphoretic.  HENT:     Head: Normocephalic and atraumatic.     Mouth/Throat:     Mouth: Mucous membranes are moist.     Pharynx: Oropharynx is clear.  Eyes:     Conjunctiva/sclera: Conjunctivae normal.     Pupils: Pupils are equal, round, and reactive to light.  Neck:      Musculoskeletal: Normal range of motion and neck supple.  Pulmonary:     Effort: Pulmonary effort is normal. No respiratory distress.     Comments: Speaking in full sentences without difficulty. Skin:    Comments: Diffuse hives to the trunk.   Neurological:     Mental Status: He is alert and oriented to person, place, and time.      UC Treatments / Results  Labs (all labs ordered are listed, but only abnormal results are displayed) Labs Reviewed - No data to display  EKG   Radiology No results found.  Procedures Procedures (including critical care time)  Medications Ordered in UC Medications - No data to display  Initial Impression / Assessment and Plan / UC Course  I have reviewed the triage vital signs and the nursing notes.  Pertinent labs & imaging results that were available during my care of the patient were reviewed by me and considered in my medical decision making (see chart for details).    No alarming signs on exam at this time.  Patient to discontinue metoprolol.  Start prednisone and antihistamine for hives.  Patient to contact cardiologist on Monday for medication changes.  Strict return precautions given.  Patient expresses understanding and agrees to plan.  Final Clinical Impressions(s) / UC Diagnoses   Final diagnoses:  Hives   ED Prescriptions    Medication Sig Dispense Auth. Provider   predniSONE (DELTASONE) 50 MG tablet Take 1 tablet (50 mg total) by mouth daily. 5 tablet Threasa AlphaYu, Orilla Templeman V, PA-C        Kenzey Birkland V, [place] JerseyPA-C 09/01/18 1603

## 2018-09-03 ENCOUNTER — Telehealth: Payer: Self-pay | Admitting: Cardiovascular Disease

## 2018-09-03 NOTE — Telephone Encounter (Signed)
New Message   Pt c/o medication issue:  1. Name of Medication: metoprolol tartrate (LOPRESSOR) 100 MG tablet  2. How are you currently taking this medication (dosage and times per day)? Take 1 tablet (100 mg total) by mouth 2 (two) times daily.  3. Are you having a reaction (difficulty breathing--STAT)? No  4. What is your medication issue? Patient had an allergic reaction to medication. Patient broke out in hives and had to go to Urgent Care on Saturday and was instructed to stop taking the medication. States that he went back to taking the 50 mg dosage that he was taking before because he noticed heart palpitations when he wasn't taking the medication.

## 2018-09-03 NOTE — Telephone Encounter (Signed)
Agree with note by Michelle Swinyer, RN  

## 2018-09-03 NOTE — Telephone Encounter (Signed)
Pt called to report that on 07/31/2018 he was changed to Metoprolol Succinate 100mg  BID secondary to elevated BP. He also takes it for palpitations.  Friday evening he developed itching, rash, hives, which he says he had before but a long time ago on the increased dose of Metoprolol not sure if succinate or tartrate.  He was told by the urgent care on Saturday morning to stop the Metoprolol Succinate. This morning he started having palpitations so he restarted the Metoprolol 50mg  Tartrate which he says he did fine on previously. He says his palpitations are now improved but he does not know what his BP is. He dose have a cuff at home.  Pt is seeing Dr. Acie Fredrickson 09/07/2018 but does not know what to take until then. He has been given some allergy meds and has had some improvement. He is unsure the names right now and do not have them with him.   He is unsure if he is "allergic" to the higher dose of Metoprolol or if it is the Tartrate vs. Succinate.   I have asked pt to monitor his BP throughout the day today and will forward to Dr. Acie Fredrickson for his review and recommendations.

## 2018-09-03 NOTE — Telephone Encounter (Signed)
   Spoke with patient who states he has felt fine since he took metoprolol tartrate 50 mg today. I advised him to continue taking this medication daily until he sees Dr. Acie Fredrickson on 7/17. I advised him to monitor pulse and BP daily and to bring those readings with him to his appointment. Patient verbalized understanding and agreement with plan and thanked me for the call.   COVID-19 Pre-Screening Questions:  . In the past 7 to 10 days have you had a cough,  shortness of breath, headache, congestion, fever (100 or greater) body aches, chills, sore throat, or sudden loss of taste or sense of smell? NO . Have you been around anyone with known Covid 19. NO . Have you been around anyone who is awaiting Covid 19 test results in the past 7 to 10 days? NO . Have you been around anyone who has been exposed to Covid 19, or has mentioned symptoms of Covid 19 within the past 7 to 10 days? NO  If you have any concerns/questions about symptoms patients report during screening (either on the phone or at threshold). Contact the provider seeing the patient or DOD for further guidance.  If neither are available contact a member of the leadership team.

## 2018-09-07 ENCOUNTER — Other Ambulatory Visit: Payer: Self-pay

## 2018-09-07 ENCOUNTER — Encounter: Payer: Self-pay | Admitting: Cardiovascular Disease

## 2018-09-07 ENCOUNTER — Ambulatory Visit (INDEPENDENT_AMBULATORY_CARE_PROVIDER_SITE_OTHER): Payer: BC Managed Care – PPO | Admitting: Cardiovascular Disease

## 2018-09-07 VITALS — BP 138/96 | HR 104 | Ht 69.0 in | Wt 207.8 lb

## 2018-09-07 DIAGNOSIS — I10 Essential (primary) hypertension: Secondary | ICD-10-CM

## 2018-09-07 DIAGNOSIS — R000 Tachycardia, unspecified: Secondary | ICD-10-CM

## 2018-09-07 MED ORDER — SPIRONOLACTONE 25 MG PO TABS: 25.0000 mg | ORAL_TABLET | Freq: Every day | ORAL | 3 refills | Status: DC

## 2018-09-07 MED ORDER — METOPROLOL TARTRATE 50 MG PO TABS: 100.0000 mg | ORAL_TABLET | Freq: Two times a day (BID) | ORAL | 3 refills | Status: DC

## 2018-09-07 NOTE — Progress Notes (Signed)
Christopher Christopher Date of Birth  1975-08-15       [place] [address] [place], [place], [place]  [address]   [place], [place]  [phone]     [phone]   Fax  [phone]    Fax [phone]  Problem List: 1. Tachycardia 2. Hypertension  Previous notes.Christopher Christopher is a 43 yo with HTN.  He's done well since I last saw him a year ago. He's not exercising as much as he would like and has gained some weight. He also has been eating some salty foods. He denies any episodes of chest pain or shortness of breath.  Sept. 22, 2014:  Christopher Christopher is doing well.  No complaints.  He is still working at [employer].  Oct. 27, 2015:  Christopher Christopher is doing ok.  Still working at [employer]. 5 PM to 10 PM.   Still eating fast foods.   Eats at [facility] frequently.   Oct. 27, 2016:  Doing well.  Still at [employer].  Oct. 23, 2017:  Christopher Christopher is seen for his 1 year Christopher Christopher . Ran out of his HCTZ  several months ago Also is eating lots of salty foods.   March 03, 2016: Is doing well.   Had some prostate irritation Still eating hot dogs  Still  No CP or dyspnea Still at [employer]  Not exercising much   April 17, 2018: Christopher Christopher is seen today for follow-up Allison. Doing well Wt is 222 lbs Still at [employer]  No CP or dyspnea  Still eating hot dogs.   September 07, 2018 :  Christopher Allison is seen today for HTN and tachycardia  Had an allergic reaction  No CP , no dyspnea.   Current Outpatient Medications on File Prior to Allison  Medication Sig Dispense Refill  . acetaminophen (TYLENOL) 500 MG tablet Take 500 mg by mouth every 6 (six) hours as needed for mild pain. For pain     . hydrochlorothiazide (HYDRODIURIL) 25 MG tablet TAKE 1 TABLET BY MOUTH EVERY DAY 90 tablet 3  . metoprolol tartrate (LOPRESSOR) 50 MG tablet Take 50 mg by mouth 2 (two) times daily.    . potassium chloride (KLOR-CON) 20 MEQ packet Take 20 mEq by mouth daily. 90 tablet 3   No current  facility-administered medications on file prior to Allison.     Allergies  Allergen Reactions  . Toprol Xl [Metoprolol Succinate] Hives and Itching    Past Medical History:  Diagnosis Date  . Abdominal pain   . Chest pain   . Heart palpitations   . Hiatal hernia   . Hypertension   . Plantar fasciitis   . Prostatitis   . Tachycardia     Past Surgical History:  Procedure Laterality Date  . CARDIAC CATHETERIZATION  1993   Normal heart catheterization in [place], [place]    Social History   Tobacco Use  Smoking Status Never Smoker  Smokeless Tobacco Never Used    Social History   Substance and Sexual Activity  Alcohol Use No    Family History  Problem Relation Age of Onset  . Hypertension Father   . Diabetes Mother     Reviw of Systems:  Reviewed in the HPI.  All other systems are negative.  Physical Exam: Blood pressure (!) 138/96, pulse (!) 104, height 5\' 9"  (1.753 m), weight 207 lb 12.8 oz (94.3 kg), SpO2 98 %.  GEN:  Well nourished, well developed in no acute distress HEENT: Normal NECK: No JVD; No carotid bruits LYMPHATICS: No lymphadenopathy CARDIAC: RRR , no murmurs, rubs, gallops RESPIRATORY:  Clear to auscultation without rales, wheezing or rhonchi  ABDOMEN: Soft, non-tender, non-distended MUSCULOSKELETAL:  No edema; No deformity  SKIN: Warm and dry NEUROLOGIC:  Alert and oriented x 3      ECG: April 17, 2018: Normal sinus rhythm at 82.  Left axis deviation.   Assessment / Plan:    1. Hypertension: He is hypertensive and tachycardic today.  We had increased his metoprolol to 100 mg twice a day but but he had an allergic reaction to the tablets.  He tolerates the 50 mg tablets.  I am not sure if there is a manufacturing difference between the 50 mg tablets and 100 mg tablets but I would like for him to take 2 of his 50 mg metoprolol tablets twice a day.  In addition, his diastolic blood pressure remains elevated.  He may respond better to  spironolactone then hydrochlorothiazide.  We will discontinue the hydrochlorothiazide and potassium chloride.  Will start on spironolactone 25 mg a day.  We will check a basic metabolic profile in 2 to 3 weeks.  I have encouraged him to work on a diet, exercise, weight loss program.   2.  Family history of diabetes mellitus:   Kristeen MissPhilip Daniell Mancinas, MD  09/07/2018 3:35 PM    [facility] [address],  [address] [place], [place]  [address] Pager [phone] Phone: [phone](336) [phone]; Fax: [phone](336) [phone]

## 2018-09-07 NOTE — Patient Instructions (Addendum)
Medication Instructions:  Your physician has recommended you make the following change in your medication:   1. Stop HCTZ 2. Stop K-Dur (potassium supplement) 3. Begin Metoprolol Tartrate, 50mg  tablets, 2 tablets (total of 100mg ), twice daily 4. Begin Spirololactone, 25mg  tablet, once daily   Labwork: Your physician recommends that you return for lab work in: 3 weeks for a BMP   Testing/Procedures: None ordered.  Follow-Up:  August 5- anytime that day for a lab draw  October 15 @ 3pm- 3 month follow up with Dr. Acie Fredrickson  Any Other Special Instructions Will Be Listed Below (If Applicable).     If you need a refill on your cardiac medications before your next appointment, please call your pharmacy.

## 2018-09-26 ENCOUNTER — Other Ambulatory Visit: Payer: BC Managed Care – PPO | Admitting: *Deleted

## 2018-09-26 ENCOUNTER — Other Ambulatory Visit: Payer: Self-pay

## 2018-09-26 DIAGNOSIS — I10 Essential (primary) hypertension: Secondary | ICD-10-CM

## 2018-09-26 DIAGNOSIS — R000 Tachycardia, unspecified: Secondary | ICD-10-CM

## 2018-09-27 LAB — BASIC METABOLIC PANEL
BUN/Creatinine Ratio: 8 — ABNORMAL LOW (ref 9–20)
BUN: 10 mg/dL (ref 6–24)
CO2: 22 mmol/L (ref 20–29)
Calcium: 9.6 mg/dL (ref 8.7–10.2)
Chloride: 101 mmol/L (ref 96–106)
Creatinine, Ser: 1.19 mg/dL (ref 0.76–1.27)
GFR calc Af Amer: 85 mL/min/{1.73_m2} (ref >59)
GFR calc non Af Amer: 73 mL/min/{1.73_m2} (ref >59)
Glucose: 113 mg/dL — ABNORMAL HIGH (ref 65–99)
Potassium: 4.4 mmol/L (ref 3.5–5.2)
Sodium: 138 mmol/L (ref 134–144)

## 2018-12-06 ENCOUNTER — Other Ambulatory Visit: Payer: Self-pay

## 2018-12-06 ENCOUNTER — Encounter: Payer: Self-pay | Admitting: Cardiovascular Disease

## 2018-12-06 ENCOUNTER — Ambulatory Visit: Payer: BC Managed Care – PPO | Admitting: Cardiovascular Disease

## 2018-12-06 VITALS — BP 136/98 | HR 81 | Ht 70.0 in | Wt 214.1 lb

## 2018-12-06 DIAGNOSIS — R000 Tachycardia, unspecified: Secondary | ICD-10-CM

## 2018-12-06 DIAGNOSIS — I10 Essential (primary) hypertension: Secondary | ICD-10-CM

## 2018-12-06 MED ORDER — SPIRONOLACTONE 50 MG PO TABS: 50.0000 mg | ORAL_TABLET | Freq: Every day | ORAL | 3 refills | Status: DC

## 2018-12-06 NOTE — Progress Notes (Signed)
Christopher Allison Date of Birth  1975/12/16       [facility] Office [address]  [address], [address] [place], [place]  [address]   [place], [place]  [address] [phone]     [phone]   Fax  [phone]    Fax [phone]  Problem List: 1. Tachycardia 2. Hypertension  Previous notes.Alvis Edgell is a 43 yo with HTN.  He's done well since I last saw him a year ago. He's not exercising as much as he would like and has gained some weight. He also has been eating some salty foods. He denies any episodes of chest pain or shortness of breath.  Sept. 22, 2014:  Jorja Loa is doing well.  No complaints.  He is still working at [employer].  Oct. 27, 2015:  Jorja Loa is doing ok.  Still working at [employer]. 5 PM to 10 PM.   Still eating fast foods.   Eats at [facility] frequently.   Oct. 27, 2016:  Doing well.  Still at [employer].  Oct. 23, 2017:  Jorja Loa is seen for his 1 year office visit . Ran out of his HCTZ  several months ago Also is eating lots of salty foods.   March 03, 2016: Is doing well.   Had some prostate irritation Still eating hot dogs  Still  No CP or dyspnea Still at [employer]  Not exercising much   April 17, 2018: Jorja Loa is seen today for follow-up visit. Doing well Wt is 222 lbs Still at [employer]  No CP or dyspnea  Still eating hot dogs.   September 07, 2018 :  Emersen is seen today for HTN and tachycardia  Had an allergic reaction  No CP , no dyspnea.   December 06, 2018:  Kasai is seen today for follow-up of his hypertension and tachycardia. We started spironolactone at his last visit  Is not exercising Is not sleeping well - only 4-5 hours.   Current Outpatient Medications on File Prior to Visit  Medication Sig Dispense Refill  . acetaminophen (TYLENOL) 500 MG tablet Take 500 mg by mouth every 6 (six) hours as needed for mild pain. For pain     . metoprolol tartrate (LOPRESSOR) 50 MG tablet Take 2 tablets (100 mg total) by mouth 2 (two)  times daily. 336 tablet 3  . spironolactone (ALDACTONE) 25 MG tablet Take 1 tablet (25 mg total) by mouth daily. 90 tablet 3   No current facility-administered medications on file prior to visit.     Allergies  Allergen Reactions  . Toprol Xl [Metoprolol Succinate] Hives and Itching    Past Medical History:  Diagnosis Date  . Abdominal pain   . Chest pain   . Heart palpitations   . Hiatal hernia   . Hypertension   . Plantar fasciitis   . Prostatitis   . Tachycardia     Past Surgical History:  Procedure Laterality Date  . CARDIAC CATHETERIZATION  1993   Normal heart catheterization in [place], [place]    Social History   Tobacco Use  Smoking Status Never Smoker  Smokeless Tobacco Never Used    Social History   Substance and Sexual Activity  Alcohol Use No    Family History  Problem Relation Age of Onset  . Hypertension Father   . Diabetes Mother     Reviw of Systems:  Reviewed in the HPI.  All other systems are negative.  Physical Exam: Blood pressure (!) 136/98, pulse 81, height 5\' 10"  (1.778 m), weight 214 lb 1.9 oz (97.1 kg), SpO2 98 %.  GEN:  Middle age male,  NAD moderately obese  HEENT: Normal NECK: No JVD; No carotid bruits LYMPHATICS: No lymphadenopathy CARDIAC: RRR , no murmurs, rubs, gallops RESPIRATORY:  Clear to auscultation without rales, wheezing or rhonchi  ABDOMEN: Soft, non-tender, non-distended MUSCULOSKELETAL:  No edema; No deformity  SKIN: Warm and dry NEUROLOGIC:  Alert and oriented x 3      ECG:    Assessment / Plan:    1. Hypertension:   Diastolic blood pressure is mildly elevated.  We will increase the spironolactone to 50 mg a day.  Encouraged him to work on improved diet, exercise, weight loss program.  He also knows that he needs to sleep better.  We will check a basic metabolic profile in 2 to 3 weeks.  I will see him in 6 months for follow-up visit.   2.  Family history of diabetes mellitus: advised  him to watch  his diet    Mertie Moores, MD  12/06/2018 3:40 PM    [address] [place],  [place], [place]  [address] Pager [phone] Phone: [phone]; Fax: [phone]

## 2018-12-06 NOTE — Patient Instructions (Addendum)
Medication Instructions:  Your physician has recommended you make the following change in your medication:   INCREASE Spironolactone (Aldactone) to 50 mg once daily  *If you need a refill on your cardiac medications before your next appointment, please call your pharmacy*  Lab Work: Your physician recommends that you return for lab work on Wed. Nov. 4 You do not have to fast and you may come in anytime between 7:30 am and 4:45 pm   Testing/Procedures: None Ordered   Follow-Up: At [facility], you and your health needs are our priority.  As part of our continuing mission to provide you with exceptional heart care, we have created designated Provider Care Teams.  These Care Teams include your primary Cardiologist (physician) and Advanced Practice Providers (APPs -  Physician Assistants and Nurse Practitioners) who all work together to provide you with the care you need, when you need it.  Your next appointment:   6 months  The format for your next appointment:   Either In Person or Virtual  Provider:   You may see Mertie Moores, MD or one of the following Advanced Practice Providers on your designated Care Team:    Richardson Dopp, PA-C  [place], [place]  Daune Perch, [place]

## 2018-12-26 ENCOUNTER — Other Ambulatory Visit: Payer: BC Managed Care – PPO | Admitting: *Deleted

## 2018-12-26 ENCOUNTER — Other Ambulatory Visit: Payer: Self-pay

## 2018-12-26 DIAGNOSIS — I10 Essential (primary) hypertension: Secondary | ICD-10-CM

## 2018-12-26 DIAGNOSIS — R000 Tachycardia, unspecified: Secondary | ICD-10-CM

## 2018-12-27 LAB — BASIC METABOLIC PANEL
BUN/Creatinine Ratio: 13 (ref 9–20)
BUN: 15 mg/dL (ref 6–24)
CO2: 23 mmol/L (ref 20–29)
Calcium: 10.1 mg/dL (ref 8.7–10.2)
Chloride: 99 mmol/L (ref 96–106)
Creatinine, Ser: 1.15 mg/dL (ref 0.76–1.27)
GFR calc Af Amer: 88 mL/min/{1.73_m2} (ref >59)
GFR calc non Af Amer: 76 mL/min/{1.73_m2} (ref >59)
Glucose: 105 mg/dL — ABNORMAL HIGH (ref 65–99)
Potassium: 4.7 mmol/L (ref 3.5–5.2)
Sodium: 137 mmol/L (ref 134–144)

## 2019-02-04 ENCOUNTER — Emergency Department (HOSPITAL_COMMUNITY)
Admission: EM | Admit: 2019-02-04 | Discharge: 2019-02-05 | Disposition: A | Discharge status: Home / Self Care | Payer: BC Managed Care – PPO | Attending: Emergency Medicine | Admitting: Emergency Medicine

## 2019-02-04 ENCOUNTER — Encounter (HOSPITAL_COMMUNITY): Payer: Self-pay | Admitting: Emergency Medicine

## 2019-02-04 DIAGNOSIS — R350 Frequency of micturition: Secondary | ICD-10-CM | POA: Diagnosis not present

## 2019-02-04 DIAGNOSIS — M549 Dorsalgia, unspecified: Secondary | ICD-10-CM | POA: Diagnosis not present

## 2019-02-04 DIAGNOSIS — Z79899 Other long term (current) drug therapy: Secondary | ICD-10-CM | POA: Insufficient documentation

## 2019-02-04 DIAGNOSIS — I10 Essential (primary) hypertension: Secondary | ICD-10-CM | POA: Diagnosis not present

## 2019-02-04 LAB — CBC WITH DIFFERENTIAL/PLATELET
Abs Immature Granulocytes: 0.11 10*3/uL — ABNORMAL HIGH (ref 0.00–0.07)
Basophils Absolute: 0.1 10*3/uL (ref 0.0–0.1)
Basophils Relative: 1 %
Eosinophils Absolute: 0.1 10*3/uL (ref 0.0–0.5)
Eosinophils Relative: 2 %
HCT: 51.1 % (ref 39.0–52.0)
Hemoglobin: 16.4 g/dL (ref 13.0–17.0)
Immature Granulocytes: 2 %
Lymphocytes Relative: 42 %
Lymphs Abs: 3 10*3/uL (ref 0.7–4.0)
MCH: 26.3 pg (ref 26.0–34.0)
MCHC: 32.1 g/dL (ref 30.0–36.0)
MCV: 82 fL (ref 80.0–100.0)
Monocytes Absolute: 0.5 10*3/uL (ref 0.1–1.0)
Monocytes Relative: 7 %
Neutro Abs: 3.3 10*3/uL (ref 1.7–7.7)
Neutrophils Relative %: 46 %
Platelets: 335 10*3/uL (ref 150–400)
RBC: 6.23 MIL/uL — ABNORMAL HIGH (ref 4.22–5.81)
RDW: 13.8 % (ref 11.5–15.5)
WBC: 7.1 10*3/uL (ref 4.0–10.5)
nRBC: 0 % (ref 0.0–0.2)

## 2019-02-04 LAB — URINALYSIS, ROUTINE W REFLEX MICROSCOPIC
Bilirubin Urine: NEGATIVE
Glucose, UA: NEGATIVE mg/dL
Hgb urine dipstick: NEGATIVE
Ketones, ur: NEGATIVE mg/dL
Leukocytes,Ua: NEGATIVE
Nitrite: NEGATIVE
Protein, ur: NEGATIVE mg/dL
Specific Gravity, Urine: 1.013 (ref 1.005–1.030)
pH: 6 (ref 5.0–8.0)

## 2019-02-04 LAB — BASIC METABOLIC PANEL
Anion gap: 10 (ref 5–15)
BUN: 11 mg/dL (ref 6–20)
CO2: 26 mmol/L (ref 22–32)
Calcium: 9.6 mg/dL (ref 8.9–10.3)
Chloride: 100 mmol/L (ref 98–111)
Creatinine, Ser: 1.11 mg/dL (ref 0.61–1.24)
GFR calc Af Amer: >60 mL/min (ref >60)
GFR calc non Af Amer: >60 mL/min (ref >60)
Glucose, Bld: 111 mg/dL — ABNORMAL HIGH (ref 70–99)
Potassium: 4 mmol/L (ref 3.5–5.1)
Sodium: 136 mmol/L (ref 135–145)

## 2019-02-04 NOTE — ED Triage Notes (Signed)
Patient reports urinary frequency and bilateral lower lateral back pain , denies injury , no hematuria or fever .

## 2019-02-05 NOTE — ED Provider Notes (Signed)
[facility] EMERGENCY DEPARTMENT Provider Note   CSN: [id number] Arrival date & time: 02/04/19  1935     History Chief Complaint  Patient presents with  . Urinary Frequency  . Back Pain    Christopher Allison is a 43 y.o. male who presents emergency department with chief complaint of urinary frequency.  Patient states that yesterday he felt like he was urinating more frequently.  He also had some pain in his lower back.  He has a previous history of prostatitis many years ago and was treated with an extended course of ciprofloxacin.  Patient states that he thought he may have a recurrence of this.  He notes that he is also on a diuretic antihypertensive that makes him urinate frequently but he felt like maybe it was more than that.  He denies fevers, rectal pain, pain with defecating, penile discharge, testicle pain, abdominal pain.  HPI     Past Medical History:  Diagnosis Date  . Abdominal pain   . Chest pain   . Heart palpitations   . Hiatal hernia   . Hypertension   . Plantar fasciitis   . Prostatitis   . Tachycardia     Patient Active Problem List   Diagnosis Date Noted  . Sinus tachycardia 12/17/2013  . Hypertension 12/03/2010    Past Surgical History:  Procedure Laterality Date  . CARDIAC CATHETERIZATION  1993   Normal heart catheterization in [place], [place]       Family History  Problem Relation Age of Onset  . Hypertension Father   . Diabetes Mother     Social History   Tobacco Use  . Smoking status: Never Smoker  . Smokeless tobacco: Never Used  Substance Use Topics  . Alcohol use: No  . Drug use: No    Home Medications Prior to Admission medications   Medication Sig Start Date End Date Taking? Authorizing Provider  acetaminophen (TYLENOL) 500 MG tablet Take 500 mg by mouth every 6 (six) hours as needed for mild pain. For pain     [provider]  metoprolol tartrate (LOPRESSOR) 50 MG tablet Take 2 tablets (100 mg  total) by mouth 2 (two) times daily. 09/07/18 08/09/19  Nahser, Wonda Cheng, MD  spironolactone (ALDACTONE) 50 MG tablet Take 1 tablet (50 mg total) by mouth daily. 12/06/18   Nahser, Wonda Cheng, MD    Allergies    Toprol xl [metoprolol succinate]  Review of Systems   Review of Systems Ten systems reviewed and are negative for acute change, except as noted in the HPI.   Physical Exam Updated Vital Signs BP (!) 140/94 (BP Location: Left Arm)   Pulse 88   Temp 98.7 F (37.1 C) (Oral)   Resp 18   Ht 5\' 9"  (1.753 m)   Wt 110 kg   SpO2 100%   BMI 35.81 kg/m   Physical Exam Vitals and nursing note reviewed.  Constitutional:      General: He is not in acute distress.    Appearance: He is well-developed. He is not diaphoretic.  HENT:     Head: Normocephalic and atraumatic.  Eyes:     General: No scleral icterus.    Conjunctiva/sclera: Conjunctivae normal.  Cardiovascular:     Rate and Rhythm: Normal rate and regular rhythm.     Heart sounds: Normal heart sounds.  Pulmonary:     Effort: Pulmonary effort is normal. No respiratory distress.     Breath sounds: Normal breath sounds.  Abdominal:     Palpations: Abdomen is soft.     Tenderness: There is no abdominal tenderness. There is no right CVA tenderness or left CVA tenderness.  Genitourinary:    Comments: Unable to perform rectal examination as patient is in a hallway bed and there is extensive ER boarding limiting access to private room. Musculoskeletal:     Cervical back: Normal range of motion and neck supple.  Skin:    General: Skin is warm and dry.  Neurological:     Mental Status: He is alert.  Psychiatric:        Behavior: Behavior normal.     ED Results / Procedures / Treatments   Labs (all labs ordered are listed, but only abnormal results are displayed) Labs Reviewed  CBC WITH DIFFERENTIAL/PLATELET - Abnormal; Notable for the following components:      Result Value   RBC 6.23 (*)    Abs Immature Granulocytes  0.11 (*)    All other components within normal limits  BASIC METABOLIC PANEL - Abnormal; Notable for the following components:   Glucose, Bld 111 (*)    All other components within normal limits  URINALYSIS, ROUTINE W REFLEX MICROSCOPIC    EKG None  Radiology No results found.  Procedures Procedures (including critical care time)  Medications Ordered in ED Medications - No data to display  ED Course  I have reviewed the triage vital signs and the nursing notes.  Pertinent labs & imaging results that were available during my care of the patient were reviewed by me and considered in my medical decision making (see chart for details).    MDM Rules/Calculators/A&P                      43 year old male here with urinary frequency.  I personally reviewed the patient's labs which shows no significant abnormalities.  Patient's urine is without any evidence of infection or contamination.  Patient has no elevated white blood cell count.  Glucose at 111 is of insignificant value. Urinary frequency does not appear to be secondary to infection.  Patient will be discharged to follow closely with his primary care physician.  Patient has no CVA tenderness to suggest pyelonephritis.  He may have a recheck of his urine in the next 3 to 5 days.  Otherwise he does not appear to have any emergent cause of his symptoms.  Final Clinical Impression(s) / ED Diagnoses Final diagnoses:  None    Rx / DC Orders ED Discharge Orders    None       Arthor Captain, PA-C 02/05/19 4098    Arby Barrette, MD 02/05/19 1048

## 2019-02-05 NOTE — Discharge Instructions (Addendum)
Your urine showed no evidence of any infection or contamination. Therefore I have extremely low suspicion that this is secondary to prostatitis.  At this point there is no reason to treat infection without evidence.  If you continue to have urinary frequency please follow-up with your primary care physician in the next 3 to 5 days for repeat urinalysis and prostate examination.   Contact your doctor if: You feel pain or irritation when you urinate. You notice blood in your urine. Your urine looks cloudy. You develop a fever. You begin vomiting. Return to the ER if: You are unable to urinate.

## 2019-02-06 ENCOUNTER — Ambulatory Visit: Payer: BC Managed Care – PPO | Admitting: Family Medicine

## 2019-02-06 ENCOUNTER — Other Ambulatory Visit: Payer: Self-pay

## 2019-02-06 VITALS — BP 130/82 | HR 86 | Temp 97.7°F | Ht 69.5 in | Wt 214.8 lb

## 2019-02-06 DIAGNOSIS — I10 Essential (primary) hypertension: Secondary | ICD-10-CM | POA: Diagnosis not present

## 2019-02-06 DIAGNOSIS — R350 Frequency of micturition: Secondary | ICD-10-CM

## 2019-02-06 LAB — POCT URINALYSIS DIP (CLINITEK)
Bilirubin, UA: NEGATIVE
Blood, UA: NEGATIVE
Glucose, UA: NEGATIVE mg/dL
Ketones, POC UA: NEGATIVE mg/dL
Leukocytes, UA: NEGATIVE
Nitrite, UA: NEGATIVE
POC PROTEIN,UA: NEGATIVE
Spec Grav, UA: <=1.005 — AB (ref 1.010–1.025)
Urobilinogen, UA: 0.2 E.U./dL
pH, UA: 7 (ref 5.0–8.0)

## 2019-02-06 NOTE — Progress Notes (Signed)
   Subjective:    Patient ID: Christopher Allison, male    DOB: [birth year], 43 y.o.   MRN: [id number]  HPI He is here for recheck visit after recent trip to the emergency room for evaluation of some back discomfort and urinary frequency.  In the ER record was reviewed.  The urinalysis was negative.  Presently he is not having difficulty with frequency or back discomfort.  He does admit to drinking large amounts of fluids to keep himself hydrated.  He does have hypertension and is taking metoprolol as well as spironolactone.   Review of Systems     Objective:   Physical Exam Alert and in no distress.  Urine dipstick was quite dilute.       Assessment & Plan:  Frequency of urination  Essential hypertension Since he is not having any difficulty with urinary frequency, further evaluation is not necessary.  He was comfortable with that.  Did recommend he back off on his fluid intake. I then discussed emergency room visits and strongly encouraged him to call me before going to the emergency room especially with the present Covid pandemic.

## 2019-02-06 NOTE — Addendum Note (Signed)
Addended byLinus Salmons, Valeria Boza D on: 02/06/2019 01:12 PM   Modules accepted: Orders

## 2019-02-18 ENCOUNTER — Other Ambulatory Visit: Payer: Self-pay

## 2019-02-18 MED ORDER — METOPROLOL TARTRATE 50 MG PO TABS: 100.0000 mg | ORAL_TABLET | Freq: Two times a day (BID) | ORAL | 3 refills | Status: DC

## 2019-05-28 ENCOUNTER — Telehealth: Payer: Self-pay | Admitting: Internal Medicine

## 2019-05-28 NOTE — Telephone Encounter (Signed)
Pt called and states he is getting his covid vaccine on Saturday but just wants to make sure due to his health conditions that its ok for him to get it

## 2019-05-28 NOTE — Telephone Encounter (Signed)
Okay to get the shot

## 2019-05-29 NOTE — Telephone Encounter (Signed)
Pt was advised [facility] 

## 2019-06-19 ENCOUNTER — Ambulatory Visit: Payer: BC Managed Care – PPO | Admitting: Physician Assistant

## 2019-07-16 ENCOUNTER — Ambulatory Visit: Payer: BC Managed Care – PPO | Admitting: Cardiovascular Disease

## 2019-07-31 ENCOUNTER — Ambulatory Visit: Payer: BC Managed Care – PPO | Admitting: Cardiovascular Disease

## 2019-07-31 ENCOUNTER — Encounter: Payer: Self-pay | Admitting: Cardiovascular Disease

## 2019-07-31 ENCOUNTER — Other Ambulatory Visit: Payer: Self-pay

## 2019-07-31 ENCOUNTER — Encounter: Payer: Self-pay | Admitting: *Deleted

## 2019-07-31 VITALS — BP 124/84 | HR 68 | Ht 69.0 in | Wt 216.5 lb

## 2019-07-31 DIAGNOSIS — E669 Obesity, unspecified: Secondary | ICD-10-CM | POA: Insufficient documentation

## 2019-07-31 DIAGNOSIS — I10 Essential (primary) hypertension: Secondary | ICD-10-CM

## 2019-07-31 NOTE — Patient Instructions (Signed)
Medication Instructions:  Your physician recommends that you continue on your current medications as directed. Please refer to the Current Medication list given to you today.  *If you need a refill on your cardiac medications before your next appointment, please call your pharmacy*   Lab Work: None  If you have labs (blood work) drawn today and your tests are completely normal, you will receive your results only by: . MyChart Message (if you have MyChart) OR . A paper copy in the mail If you have any lab test that is abnormal or we need to change your treatment, we will call you to review the results.   Testing/Procedures: None   Follow-Up: At [facility], you and your health needs are our priority.  As part of our continuing mission to provide you with exceptional heart care, we have created designated Provider Care Teams.  These Care Teams include your primary Cardiologist (physician) and Advanced Practice Providers (APPs -  Physician Assistants and Nurse Practitioners) who all work together to provide you with the care you need, when you need it.  We recommend signing up for the patient portal called "MyChart".  Sign up information is provided on this After Visit Summary.  MyChart is used to connect with patients for Virtual Visits (Telemedicine).  Patients are able to view lab/test results, encounter notes, upcoming appointments, etc.  Non-urgent messages can be sent to your provider as well.   To learn more about what you can do with MyChart, go to [email or web address].    Your next appointment:   12 month(s)  The format for your next appointment:   In Person  Provider:   You may see Philip Nahser, MD or one of the following Advanced Practice Providers on your designated Care Team:    Scott Weaver, PA-C  Vin Bhagat, PA-C    Other Instructions   

## 2019-07-31 NOTE — Progress Notes (Signed)
Christopher Allison Date of Birth  [birth year]       [facility] Office [address]  [address] [place], [place]  [address]   [place], [place]  [address] [phone]     [phone]   Fax  [phone]    Fax [phone]  Problem List: 1. Tachycardia 2. Hypertension  Previous notes.Christopher Allison is a 44 yo with HTN.  He's done well since I last saw him a year ago. He's not exercising as much as he would like and has gained some weight. He also has been eating some salty foods. He denies any episodes of chest pain or shortness of breath.  Sept. 22, 2014:  Christopher Allison is doing well.  No complaints.  He is still working at [employer].  Oct. 27, 2015:  Christopher Allison is doing ok.  Still working at [employer]. 5 PM to 10 PM.   Still eating fast foods.   Eats at [employer] frequently.   Oct. 27, 2016:  Doing well.  Still at [employer].  Oct. 23, 2017:  Christopher Allison is seen for his 1 year office visit . Ran out of his HCTZ  several months ago Also is eating lots of salty foods.   March 03, 2016: Is doing well.   Had some prostate irritation Still eating hot dogs  Still  No CP or dyspnea Still at [employer]  Not exercising much   April 17, 2018: Christopher Allison is seen today for follow-up visit. Doing well Wt is 222 lbs Still at [employer]  No CP or dyspnea  Still eating hot dogs.   September 07, 2018 :  Christopher Allison is seen today for HTN and tachycardia  Had an allergic reaction  No CP , no dyspnea.   December 06, 2018:  Christopher Allison is seen today for follow-up of his hypertension and tachycardia. We started spironolactone at his last visit  Is not exercising Is not sleeping well - only 4-5 hours.   June, 9, 2021:  Christopher Allison is seen for follow up of his HTN Is sleeping  Perhaps a bit better .  Wt is 216  No cp , no dyspnea     Current Outpatient Medications on File Prior to Visit  Medication Sig Dispense Refill  . acetaminophen (TYLENOL) 500 MG tablet Take 500 mg by mouth every 6 (six)  hours as needed for mild pain. For pain     . metoprolol tartrate (LOPRESSOR) 50 MG tablet Take 2 tablets (100 mg total) by mouth 2 (two) times daily. 336 tablet 3  . spironolactone (ALDACTONE) 50 MG tablet Take 1 tablet (50 mg total) by mouth daily. 90 tablet 3   No current facility-administered medications on file prior to visit.    Allergies  Allergen Reactions  . Toprol Xl [Metoprolol Succinate] Hives and Itching    Past Medical History:  Diagnosis Date  . Abdominal pain   . Chest pain   . Heart palpitations   . Hiatal hernia   . Hypertension   . Plantar fasciitis   . Prostatitis   . Tachycardia     Past Surgical History:  Procedure Laterality Date  . CARDIAC CATHETERIZATION  1993   Normal heart catheterization in [place], [place]    Social History   Tobacco Use  Smoking Status Never Smoker  Smokeless Tobacco Never Used    Social History   Substance and Sexual Activity  Alcohol Use No    Family History  Problem Relation Age of Onset  . Hypertension Father   . Diabetes Mother     Reviw of Systems:  Reviewed in the HPI.  All other systems are negative.   Physical Exam: Blood pressure 124/84, pulse 68, height 5\' 9"  (1.753 m), weight 216 lb 8 oz (98.2 kg), SpO2 98 %.  GEN:  Young black male,  Moderately obese.  HEENT: Normal NECK: No JVD; No carotid bruits LYMPHATICS: No lymphadenopathy CARDIAC: RRR , no murmurs, rubs, gallops RESPIRATORY:  Clear to auscultation without rales, wheezing or rhonchi  ABDOMEN: Soft, non-tender, non-distended MUSCULOSKELETAL:  No edema; No deformity  SKIN: Warm and dry NEUROLOGIC:  Alert and oriented x 3  ECG: July 31, 2019: Normal sinus rhythm at 69.  No ST or T wave changes.   Assessment / Plan:    1. Hypertension:   Blood pressure looks well controlled.  Continue current medications.  I see him again in 1 year.  We will check a basic metabolic profile at that time.  2.  Family history of diabetes mellitus: advised   him to watch his diet. I encouraged him to watch his diet.  He is to cut out things that are white, weak, sweet.  3. Obesity:   Advised weight loss    See him again in 1 year.    Mertie Moores, MD  07/31/2019 4:06 PM    [facility] [place],  [place], [place]  [address] Pager [phone] Phone: [phone]; Fax: [phone]

## 2019-10-17 ENCOUNTER — Other Ambulatory Visit: Payer: Self-pay

## 2019-10-17 ENCOUNTER — Encounter (HOSPITAL_COMMUNITY): Payer: Self-pay | Admitting: Emergency Medicine

## 2019-10-17 ENCOUNTER — Ambulatory Visit (HOSPITAL_COMMUNITY)
Admission: EM | Admit: 2019-10-17 | Discharge: 2019-10-17 | Disposition: A | Discharge status: Home / Self Care | Payer: BC Managed Care – PPO | Attending: Family Medicine | Admitting: Family Medicine

## 2019-10-17 DIAGNOSIS — Z87438 Personal history of other diseases of male genital organs: Secondary | ICD-10-CM

## 2019-10-17 DIAGNOSIS — M545 Low back pain, unspecified: Secondary | ICD-10-CM

## 2019-10-17 LAB — POCT URINALYSIS DIPSTICK, ED / UC
Bilirubin Urine: NEGATIVE
Glucose, UA: NEGATIVE mg/dL
Hgb urine dipstick: NEGATIVE
Leukocytes,Ua: NEGATIVE
Nitrite: NEGATIVE
Protein, ur: NEGATIVE mg/dL
Specific Gravity, Urine: >=1.03 (ref 1.005–1.030)
Urobilinogen, UA: 0.2 mg/dL (ref 0.0–1.0)
pH: 6 (ref 5.0–8.0)

## 2019-10-17 MED ORDER — CIPROFLOXACIN HCL 500 MG PO TABS: 500.0000 mg | ORAL_TABLET | Freq: Two times a day (BID) | ORAL | 0 refills | Status: DC

## 2019-10-17 MED ORDER — IBUPROFEN 800 MG PO TABS: 800.0000 mg | ORAL_TABLET | Freq: Three times a day (TID) | ORAL | 0 refills | Status: DC

## 2019-10-17 MED ORDER — METHOCARBAMOL 500 MG PO TABS: 500.0000 mg | ORAL_TABLET | Freq: Three times a day (TID) | ORAL | 0 refills | Status: DC

## 2019-10-17 NOTE — Discharge Instructions (Signed)
Home to rest Drink more water Take antibiotic 2 times a day Take ibuprofen 3 times a day with food Take methocarbamol as needed muscle relaxer.  This is usually better Call your primary care doctor if not improved by Monday

## 2019-10-17 NOTE — ED Triage Notes (Signed)
Pt c/o lower back pain with possible prostatitis onset Monday night. He states this is a recurrent problem. Pt denies any urinary symptoms. Just trouble moving around.

## 2019-10-17 NOTE — ED Provider Notes (Signed)
[facility]    CSN: [id number] Arrival date & time: 10/17/19  1448      History   Chief Complaint Chief Complaint  Patient presents with  . Back Pain    HPI Christopher Allison is a 44 y.o. male.   HPI  Patient works at [employer].  He does a lot of heavy lifting.  He states that he has had low back pain that is muscular in nature many times throughout his career.  He is also had prostate infections on more than one occasion.  He has been treated by his primary care doctor.  He usually does not need to go to an ER or urgent care.  He was unable to see his primary care doctor today.  He states he was unable to finish his work today because of his pain. He has never seen urology.  He states he usually gets better in a couple of days with Cipro.  Now has back pain that feels is more prostate than low back, it is in the lower back in the posterior pelvis and tailbone area.  It is worse with movement.  It is worse with sitting.  He has no dysuria, frequency, hematuria or cloudy urine.  He has had no fever or chills, no body aches, no nausea or vomiting.  He does not feel like he has any tightness in his muscles like he normally does with a lumbar strain Well-controlled hypertension on Lopressor and spironolactone States is in good health.  Past Medical History:  Diagnosis Date  . Abdominal pain   . Chest pain   . Heart palpitations   . Hiatal hernia   . Hypertension   . Plantar fasciitis   . Prostatitis   . Tachycardia     Patient Active Problem List   Diagnosis Date Noted  . Obesity 07/31/2019  . Sinus tachycardia 12/17/2013  . Hypertension 12/03/2010    Past Surgical History:  Procedure Laterality Date  . CARDIAC CATHETERIZATION  1993   Normal heart catheterization in [place], [place]       Home Medications    Prior to Admission medications   Medication Sig Start Date End Date Taking? Authorizing Provider  acetaminophen (TYLENOL) 500 MG tablet Take 500 mg by mouth  every 6 (six) hours as needed for mild pain. For pain     [provider]  ciprofloxacin (CIPRO) 500 MG tablet Take 1 tablet (500 mg total) by mouth 2 (two) times daily. 10/17/19   Eustace Moore, MD  ibuprofen (ADVIL) 800 MG tablet Take 1 tablet (800 mg total) by mouth 3 (three) times daily. 10/17/19   Eustace Moore, MD  methocarbamol (ROBAXIN) 500 MG tablet Take 1 tablet (500 mg total) by mouth 3 (three) times daily. 10/17/19   Eustace Moore, MD  metoprolol tartrate (LOPRESSOR) 50 MG tablet Take 2 tablets (100 mg total) by mouth 2 (two) times daily. 02/18/19 01/20/20  Nahser, Deloris Ping, MD  spironolactone (ALDACTONE) 50 MG tablet Take 1 tablet (50 mg total) by mouth daily. 12/06/18   Nahser, Deloris Ping, MD    Family History Family History  Problem Relation Age of Onset  . Hypertension Father   . Diabetes Mother     Social History Social History   Tobacco Use  . Smoking status: Never Smoker  . Smokeless tobacco: Never Used  Substance Use Topics  . Alcohol use: No  . Drug use: No     Allergies   Toprol xl [  metoprolol succinate]   Review of Systems Review of Systems See HPI  Physical Exam Triage Vital Signs ED Triage Vitals  Enc Vitals Group     BP 10/17/19 1552 (!) 144/78     Pulse Rate 10/17/19 1552 72     Resp 10/17/19 1552 16     Temp 10/17/19 1552 98.3 F (36.8 C)     Temp Source 10/17/19 1552 Oral     SpO2 10/17/19 1552 100 %     Weight --      Height --      Head Circumference --      Peak Flow --      Pain Score 10/17/19 1549 8     Pain Loc --      Pain Edu? --      Excl. in GC? --    No data found.  Updated Vital Signs BP (!) 144/78 (BP Location: Right Arm)   Pulse 72   Temp 98.3 F (36.8 C) (Oral)   Resp 16   SpO2 100%       Physical Exam Constitutional:      General: He is in acute distress.     Appearance: He is well-developed and normal weight.     Comments: Guarded movements.  Appears acutely uncomfortable  HENT:      Head: Normocephalic and atraumatic.     Nose:     Comments: Mask is in place Eyes:     Conjunctiva/sclera: Conjunctivae normal.     Pupils: Pupils are equal, round, and reactive to light.  Cardiovascular:     Rate and Rhythm: Normal rate and regular rhythm.     Heart sounds: Normal heart sounds.  Pulmonary:     Effort: Pulmonary effort is normal. No respiratory distress.     Breath sounds: Normal breath sounds.  Abdominal:     General: There is no distension.     Palpations: Abdomen is soft.     Tenderness: There is no abdominal tenderness.  Musculoskeletal:        General: Normal range of motion.     Cervical back: Normal range of motion.     Comments: No tenderness to palpation of the lumbar bony spines or muscles.  Mild tenderness across the SI joints and central pelvis.  Skin:    General: Skin is warm and dry.  Neurological:     Mental Status: He is alert.     Gait: Gait abnormal.  Psychiatric:        Mood and Affect: Mood normal.        Behavior: Behavior normal.      UC Treatments / Results  Labs (all labs ordered are listed, but only abnormal results are displayed) Labs Reviewed  POCT URINALYSIS DIPSTICK, ED / UC - Abnormal; Notable for the following components:      Result Value   Ketones, ur TRACE (*)    All other components within normal limits  URINE CULTURE    EKG   Radiology No results found.  Procedures Procedures (including critical care time)  Medications Ordered in UC Medications - No data to display  Initial Impression / Assessment and Plan / UC Course  I have reviewed the triage vital signs and the nursing notes.  Pertinent labs & imaging results that were available during my care of the patient were reviewed by me and considered in my medical decision making (see chart for details).     Patient states he is previously been treated  with Cipro for this low back pain.  He does not have any findings on urinalysis.  Declines rectal exam.   His history and physical examination support more of a low back mechanical pain.  However, patient is a good historian and states he consistently gets better on Cipro.  We will give him this treatment but emphasized he should follow-up with his primary care doctor Final Clinical Impressions(s) / UC Diagnoses   Final diagnoses:  Acute midline low back pain without sciatica  History of prostatitis     Discharge Instructions     Home to rest Drink more water Take antibiotic 2 times a day Take ibuprofen 3 times a day with food Take methocarbamol as needed muscle relaxer.  This is usually better Call your primary care doctor if not improved by Monday    ED Prescriptions    Medication Sig Dispense Auth. Provider   ciprofloxacin (CIPRO) 500 MG tablet Take 1 tablet (500 mg total) by mouth 2 (two) times daily. 28 tablet Eustace Moore, MD   ibuprofen (ADVIL) 800 MG tablet Take 1 tablet (800 mg total) by mouth 3 (three) times daily. 21 tablet Eustace Moore, MD   methocarbamol (ROBAXIN) 500 MG tablet Take 1 tablet (500 mg total) by mouth 3 (three) times daily. 30 tablet Eustace Moore, MD     PDMP not reviewed this encounter.   Eustace Moore, MD 10/17/19 2236

## 2019-10-18 LAB — URINE CULTURE
Culture: NO GROWTH
Special Requests: NORMAL

## 2019-10-21 ENCOUNTER — Telehealth: Payer: Self-pay | Admitting: Cardiovascular Disease

## 2019-10-21 ENCOUNTER — Telehealth (HOSPITAL_COMMUNITY): Payer: Self-pay | Admitting: Emergency Medicine

## 2019-10-21 NOTE — Telephone Encounter (Signed)
Patient went to ER for lower back pain and they put him on medication and has a burning sensation and soreness. Wants to know if the medication they put him on (ciprofloxacin (CIPRO) 500 MG tablet) interacts with the medication (metoprolol tartrate (LOPRESSOR) 50 MG tablet and spironolactone (ALDACTONE) 50 MG tablet). Please call to verify

## 2019-10-21 NOTE — Telephone Encounter (Signed)
Patient reports he was seen in Urgent Care for burning sensation and soreness in lower back. Cipro was prescribed for possible prostatitis. I gave patient information from pharmacist and asked him to contact PCP or provider covering for his PCP for recommendations.

## 2019-10-21 NOTE — Telephone Encounter (Signed)
Dr. Leonides Grills made aware of patient's concern for the medications he is on and needing to change them.  States he will review and prescribe necessary medication change.

## 2019-10-21 NOTE — Telephone Encounter (Signed)
There is a small risk of arrhthymias when using spironolactone and cipro together. I would avoid using cipro if possible. Patient urine cx was negative. This does not appear to be an infection. I would follow up with PCP for the need for abx.

## 2019-10-22 NOTE — Telephone Encounter (Signed)
Per Dr. Leonides Grills, patient does not need to continue abx.  Attempted to call patient to review, no answer, LVM

## 2019-12-16 ENCOUNTER — Other Ambulatory Visit: Payer: Self-pay | Admitting: Cardiovascular Disease

## 2020-04-02 ENCOUNTER — Other Ambulatory Visit: Payer: Self-pay

## 2020-04-02 ENCOUNTER — Ambulatory Visit: Payer: BC Managed Care – PPO | Admitting: Family Medicine

## 2020-04-02 VITALS — BP 158/98 | HR 69 | Temp 98.0°F | Ht 69.0 in | Wt 214.8 lb

## 2020-04-02 DIAGNOSIS — R3911 Hesitancy of micturition: Secondary | ICD-10-CM

## 2020-04-02 DIAGNOSIS — Z23 Encounter for immunization: Secondary | ICD-10-CM

## 2020-04-02 DIAGNOSIS — Z1211 Encounter for screening for malignant neoplasm of colon: Secondary | ICD-10-CM | POA: Diagnosis not present

## 2020-04-02 DIAGNOSIS — N410 Acute prostatitis: Secondary | ICD-10-CM | POA: Diagnosis not present

## 2020-04-02 LAB — POCT URINALYSIS DIP (PROADVANTAGE DEVICE)
Bilirubin, UA: NEGATIVE
Blood, UA: NEGATIVE
Glucose, UA: NEGATIVE mg/dL
Ketones, POC UA: NEGATIVE mg/dL
Leukocytes, UA: NEGATIVE
Nitrite, UA: NEGATIVE
Protein Ur, POC: NEGATIVE mg/dL
Specific Gravity, Urine: 1.01
Urobilinogen, Ur: 0.2
pH, UA: 7.5 (ref 5.0–8.0)

## 2020-04-02 MED ORDER — SULFAMETHOXAZOLE-TRIMETHOPRIM 800-160 MG PO TABS: 1.0000 | ORAL_TABLET | Freq: Two times a day (BID) | ORAL | 0 refills | Status: DC

## 2020-04-02 NOTE — Progress Notes (Signed)
   Subjective:    Patient ID: Christopher Allison, male    DOB: 06-29-1975, 45 y.o.   MRN: [id number]  HPI He complains of difficulty with low back pain, urinary hesitancy but no dysuria, discharge.  No recent sexual activity.  He does have a previous history of prostatitis.  No fever, chills, testicular discomfort, nausea or vomiting.   Review of Systems     Objective:   Physical Exam Urine dipstick was negative.  Rectal exam does show a tender boggy prostate. His medical record was reviewed in regard to colon cancer screening and immunizations.      Assessment & Plan:  Acute prostatitis  Screening for colon cancer - Plan: Cologuard  Need for Tdap vaccination - Plan: Tdap vaccine greater than or equal to 7yo IM  Urinary hesitancy - Plan: POCT Urinalysis DIP (Proadvantage Device) Septra called in.  He will call me if not entirely better when he finishes the antibiotic.  Also encouraged him to set up for complete examination. Also discussed getting the Covid booster but at this time he is not interested in pursuing that.

## 2020-04-10 ENCOUNTER — Other Ambulatory Visit: Payer: Self-pay | Admitting: Cardiovascular Disease

## 2020-05-12 ENCOUNTER — Encounter: Payer: Self-pay | Admitting: Family Medicine

## 2020-07-14 ENCOUNTER — Encounter: Payer: BC Managed Care – PPO | Admitting: Family Medicine

## 2020-07-17 ENCOUNTER — Encounter: Payer: Self-pay | Admitting: Family Medicine

## 2020-09-15 ENCOUNTER — Other Ambulatory Visit: Payer: Self-pay | Admitting: Cardiovascular Disease

## 2020-11-24 ENCOUNTER — Ambulatory Visit (INDEPENDENT_AMBULATORY_CARE_PROVIDER_SITE_OTHER): Payer: BC Managed Care – PPO | Admitting: Cardiovascular Disease

## 2020-11-24 ENCOUNTER — Other Ambulatory Visit: Payer: Self-pay

## 2020-11-24 ENCOUNTER — Encounter: Payer: Self-pay | Admitting: Cardiovascular Disease

## 2020-11-24 VITALS — BP 125/88 | HR 68 | Ht 69.0 in | Wt 222.6 lb

## 2020-11-24 DIAGNOSIS — I10 Essential (primary) hypertension: Secondary | ICD-10-CM | POA: Diagnosis not present

## 2020-11-24 DIAGNOSIS — R000 Tachycardia, unspecified: Secondary | ICD-10-CM

## 2020-11-24 MED ORDER — LOSARTAN POTASSIUM 50 MG PO TABS: 50.0000 mg | ORAL_TABLET | Freq: Every day | ORAL | 11 refills | Status: DC

## 2020-11-24 NOTE — Progress Notes (Signed)
Christopher Allison Date of Birth  1975-11-25       [facility] Office [address]  [address] [place], [place]  [address]   [place], [place]  [address] [phone]     [phone]   Fax  [phone]    Fax [phone]  Problem List: 1. Tachycardia 2. Hypertension  Previous notes.Christopher Allison is a 45 yo with HTN.  He's done well since I last saw him a year ago. He's not exercising as much as he would like and has gained some weight. He also has been eating some salty foods. He denies any episodes of chest pain or shortness of breath.  Sept. 22, 2014:  Christopher Allison is doing well.  No complaints.  He is still working at [employer].  Oct. 27, 2015:  Christopher Allison is doing ok.  Still working at [employer]. 5 PM to 10 PM.   Still eating fast foods.   Eats at [employer] frequently.   Oct. 27, 2016:  Doing well.  Still at [employer].  Oct. 23, 2017:  Christopher Allison is seen for his 1 year office visit . Ran out of his HCTZ  several months ago Also is eating lots of salty foods.   March 03, 2016: Is doing well.   Had some prostate irritation Still eating hot dogs  Still  No CP or dyspnea Still at [employer]  Not exercising much   April 17, 2018: Christopher Allison is seen today for follow-up visit. Doing well Wt is 222 lbs Still at [employer]  No CP or dyspnea  Still eating hot dogs.   September 07, 2018 :  Christopher Allison is seen today for HTN and tachycardia  Had an allergic reaction  No CP , no dyspnea.   December 06, 2018:  Christopher Allison is seen today for follow-up of his hypertension and tachycardia. We started spironolactone at his last visit  Is not exercising Is not sleeping well - only 4-5 hours.   June, 9, 2021:  Christopher Allison is seen for follow up of his HTN Is sleeping  Perhaps a bit better .  Wt is 216  No cp , no dyspnea   November 24, 2020: Christopher Allison is seen today for follow-up of his hypertension. Having difficulty sleeping  Still very active  Still working with [employer] - but no longer  working double shifts.  Wt is 222 ( up 6 lbs from last year )    Current Outpatient Medications on File Prior to Visit  Medication Sig Dispense Refill   acetaminophen (TYLENOL) 500 MG tablet Take 500 mg by mouth every 6 (six) hours as needed for mild pain. For pain     metoprolol tartrate (LOPRESSOR) 50 MG tablet TAKE 2 TABLETS (100 MG TOTAL) BY MOUTH 2 (TWO) TIMES DAILY. 336 tablet 3   spironolactone (ALDACTONE) 50 MG tablet TAKE 1 TABLET BY MOUTH EVERY DAY 90 tablet 2   ciprofloxacin (CIPRO) 500 MG tablet Take 1 tablet (500 mg total) by mouth 2 (two) times daily. (Patient not taking: No sig reported) 28 tablet 0   ibuprofen (ADVIL) 800 MG tablet Take 1 tablet (800 mg total) by mouth 3 (three) times daily. (Patient not taking: Reported on 11/24/2020) 21 tablet 0   methocarbamol (ROBAXIN) 500 MG tablet Take 1 tablet (500 mg total) by mouth 3 (three) times daily. (Patient not taking: Reported on 11/24/2020) 30 tablet 0   sulfamethoxazole-trimethoprim (BACTRIM DS) 800-160 MG tablet Take 1 tablet by  mouth 2 (two) times daily. (Patient not taking: Reported on 11/24/2020) 28 tablet 0   No current facility-administered medications on file prior to visit.    Allergies  Allergen Reactions   Toprol Xl [Metoprolol Succinate] Hives and Itching    Past Medical History:  Diagnosis Date   Abdominal pain    Chest pain    Heart palpitations    Hiatal hernia    Hypertension    Plantar fasciitis    Prostatitis    Tachycardia     Past Surgical History:  Procedure Laterality Date   CARDIAC CATHETERIZATION  1993   Normal heart catheterization in [place], [place]    Social History   Tobacco Use  Smoking Status Never  Smokeless Tobacco Never    Social History   Substance and Sexual Activity  Alcohol Use No    Family History  Problem Relation Age of Onset   Hypertension Father    Diabetes Mother     Reviw of Systems:  Reviewed in the HPI.  All other systems are negative.  Physical  Exam: Blood pressure (!) 148/98, pulse 68, height 5\' 9"  (1.753 m), weight 222 lb 9.6 oz (101 kg), SpO2 99 %.  GEN:  moderately obese, young male , well developed in no acute distress HEENT: Normal NECK: No JVD; No carotid bruits LYMPHATICS: No lymphadenopathy CARDIAC: RRR , no murmurs, rubs, gallops RESPIRATORY:  Clear to auscultation without rales, wheezing or rhonchi  ABDOMEN: Soft, non-tender, non-distended MUSCULOSKELETAL:  No edema; No deformity  SKIN: Warm and dry NEUROLOGIC:  Alert and oriented x 3   ECG: November 24, 2020: Normal sinus rhythm at 68.  No ST or T wave changes.   Assessment / Plan:    1. Hypertension:   Blood pressure remains mildly elevated.  I suspect a lot of this has to do with his weight gain.  I encouraged him to work on weight loss.  He needs to restrict his calories and also restrict his carbohydrates.  We will add losartan 50 mg a day to his blood pressure regimen.  We will check a basic metabolic profile in 2 to 3 weeks.  2.  Family history of diabetes mellitus:   3. Obesity:    Advised weight loss.   See him again in 1 year.    November 26, 2020, MD  11/24/2020 3:39 PM    [facility] [address] [place],  [address] [place], [place]  [place] Pager [phone] Phone: [phone]; Fax: [phone]

## 2020-11-24 NOTE — Patient Instructions (Signed)
Medication Instructions:  Your physician has recommended you make the following change in your medication:  START Losartan (Cozaar) 50 mg once daily  *If you need a refill on your cardiac medications before your next appointment, please call your pharmacy*   Lab Work: Your physician recommends that you return for lab work on Friday October 21. You may come in anytime between 7:30 am and 4:45 pm. You do not have to fast for this appointment.   If you have labs (blood work) drawn today and your tests are completely normal, you will receive your results only by: MyChart Message (if you have MyChart) OR A paper copy in the mail If you have any lab test that is abnormal or we need to change your treatment, we will call you to review the results.   Testing/Procedures: None Ordered   Follow-Up: At [facility], you and your health needs are our priority.  As part of our continuing mission to provide you with exceptional heart care, we have created designated Provider Care Teams.  These Care Teams include your primary Cardiologist (physician) and Advanced Practice Providers (APPs -  Physician Assistants and Nurse Practitioners) who all work together to provide you with the care you need, when you need it.   Your next appointment:   1 year(s)  The format for your next appointment:   In Person  Provider:   You may see Kristeen Miss, MD or one of the following Advanced Practice Providers on your designated Care Team:   Tereso Newcomer, PA-C [place], [place]

## 2020-12-11 ENCOUNTER — Other Ambulatory Visit: Payer: Self-pay

## 2020-12-11 ENCOUNTER — Other Ambulatory Visit: Payer: BC Managed Care – PPO | Admitting: *Deleted

## 2020-12-11 DIAGNOSIS — R000 Tachycardia, unspecified: Secondary | ICD-10-CM

## 2020-12-11 DIAGNOSIS — I10 Essential (primary) hypertension: Secondary | ICD-10-CM

## 2020-12-12 LAB — BASIC METABOLIC PANEL
BUN/Creatinine Ratio: 19 (ref 9–20)
BUN: 21 mg/dL (ref 6–24)
CO2: 23 mmol/L (ref 20–29)
Calcium: 10 mg/dL (ref 8.7–10.2)
Chloride: 98 mmol/L (ref 96–106)
Creatinine, Ser: 1.12 mg/dL (ref 0.76–1.27)
Glucose: 101 mg/dL — ABNORMAL HIGH (ref 70–99)
Potassium: 4.5 mmol/L (ref 3.5–5.2)
Sodium: 136 mmol/L (ref 134–144)
eGFR: 82 mL/min/{1.73_m2} (ref >59)

## 2021-01-11 ENCOUNTER — Telehealth: Payer: Self-pay | Admitting: Cardiovascular Disease

## 2021-01-11 ENCOUNTER — Other Ambulatory Visit: Payer: Self-pay | Admitting: Cardiovascular Disease

## 2021-01-11 MED ORDER — METOPROLOL TARTRATE 50 MG PO TABS: 100.0000 mg | ORAL_TABLET | Freq: Two times a day (BID) | ORAL | 0 refills | Status: DC

## 2021-01-11 NOTE — Telephone Encounter (Signed)
Pt's medication was sent to pt's pharmacy as requested. Confirmation received.  °
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
## 2021-02-11 ENCOUNTER — Other Ambulatory Visit: Payer: Self-pay

## 2021-02-11 ENCOUNTER — Emergency Department (HOSPITAL_COMMUNITY)
Admission: EM | Admit: 2021-02-11 | Discharge: 2021-02-11 | Disposition: A | Discharge status: Home / Self Care | Payer: BC Managed Care – PPO | Attending: Student | Admitting: Student

## 2021-02-11 DIAGNOSIS — Z79899 Other long term (current) drug therapy: Secondary | ICD-10-CM | POA: Diagnosis not present

## 2021-02-11 DIAGNOSIS — Y92410 Unspecified street and highway as the place of occurrence of the external cause: Secondary | ICD-10-CM | POA: Diagnosis not present

## 2021-02-11 DIAGNOSIS — M542 Cervicalgia: Secondary | ICD-10-CM

## 2021-02-11 DIAGNOSIS — I10 Essential (primary) hypertension: Secondary | ICD-10-CM | POA: Insufficient documentation

## 2021-02-11 DIAGNOSIS — G8911 Acute pain due to trauma: Secondary | ICD-10-CM | POA: Insufficient documentation

## 2021-02-11 MED ORDER — METHOCARBAMOL 500 MG PO TABS: 500.0000 mg | ORAL_TABLET | Freq: Three times a day (TID) | ORAL | 0 refills | Status: DC | PRN

## 2021-02-11 MED ORDER — LIDOCAINE 4 % EX PTCH: 1.0000 | MEDICATED_PATCH | Freq: Two times a day (BID) | CUTANEOUS | 0 refills | Status: DC | PRN
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
## 2021-02-16 ENCOUNTER — Telehealth: Payer: Self-pay | Admitting: Family Medicine
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
## 2021-03-17 ENCOUNTER — Other Ambulatory Visit: Payer: Self-pay | Admitting: Cardiovascular Disease

## 2021-05-27 ENCOUNTER — Telehealth: Payer: Self-pay | Admitting: Cardiovascular Disease

## 2021-05-27 ENCOUNTER — Other Ambulatory Visit: Payer: Self-pay | Admitting: Cardiovascular Disease

## 2021-05-27 NOTE — Telephone Encounter (Signed)
Patient would like to know if it is safe for him to use the following two supplemental teas: ?Healthy Prostate   ?Pro Dental Tea  ?

## 2021-05-28 NOTE — Telephone Encounter (Signed)
Please ask patient to send [place] a picture of the specific tea and its ingredients for [place] to review ?

## 2021-05-28 NOTE — Telephone Encounter (Signed)
Attempted to return pt call.....Marland Kitchen no answer, recording says "your call cannot go through at this time, please try again later" ?

## 2021-06-01 NOTE — Telephone Encounter (Signed)
Sent MyChart message asking pt to upload picture of tea w ingredients. ?

## 2021-06-04 NOTE — Telephone Encounter (Signed)
Teas are ok. One tea has saw palmetto in it. Does not interact with his current medications but should be aware in future that this does have interactions with several medications. ?

## 2021-10-19 ENCOUNTER — Other Ambulatory Visit: Payer: Self-pay | Admitting: Cardiovascular Disease

## 2021-10-27 ENCOUNTER — Encounter: Payer: Self-pay | Admitting: Internal Medicine

## 2021-11-30 ENCOUNTER — Encounter: Payer: Self-pay | Admitting: Internal Medicine

## 2021-12-13 ENCOUNTER — Encounter: Payer: Self-pay | Admitting: Internal Medicine

## 2021-12-13 ENCOUNTER — Other Ambulatory Visit: Payer: Self-pay | Admitting: Cardiovascular Disease

## 2021-12-25 ENCOUNTER — Other Ambulatory Visit: Payer: Self-pay | Admitting: Cardiovascular Disease

## 2022-01-28 ENCOUNTER — Other Ambulatory Visit: Payer: Self-pay | Admitting: Cardiovascular Disease

## 2022-01-31 ENCOUNTER — Encounter: Payer: Self-pay | Admitting: Cardiovascular Disease

## 2022-01-31 ENCOUNTER — Ambulatory Visit: Payer: BC Managed Care – PPO | Attending: Cardiovascular Disease | Admitting: Cardiovascular Disease

## 2022-01-31 VITALS — BP 130/86 | HR 60 | Ht 69.0 in | Wt 229.0 lb

## 2022-01-31 DIAGNOSIS — I10 Essential (primary) hypertension: Secondary | ICD-10-CM

## 2022-01-31 DIAGNOSIS — Z79899 Other long term (current) drug therapy: Secondary | ICD-10-CM

## 2022-01-31 MED ORDER — FUROSEMIDE 40 MG PO TABS: 40.0000 mg | ORAL_TABLET | Freq: Every day | ORAL | 3 refills | Status: DC

## 2022-01-31 MED ORDER — POTASSIUM CHLORIDE ER 10 MEQ PO TBCR: 10.0000 meq | EXTENDED_RELEASE_TABLET | Freq: Two times a day (BID) | ORAL | 3 refills | Status: DC

## 2022-01-31 NOTE — Progress Notes (Signed)
Christopher Allison Date of Birth  1975/08/30       [facility] Office [address]  [address] [place], [place]  [address]   [place], [place]  [address] [phone]     [phone]   Fax  [phone]    Fax [phone]  Problem List: 1. Tachycardia 2. Hypertension  Previous notes.Christopher Allison is a 46 yo with HTN.  He's done well since I last saw him a year ago. He's not exercising as much as he would like and has gained some weight. He also has been eating some salty foods. He denies any episodes of chest pain or shortness of breath.  Sept. 22, 2014:  Christopher Allison is doing well.  No complaints.  He is still working at [employer].  Oct. 27, 2015:  Christopher Allison is doing ok.  Still working at [employer]. 5 PM to 10 PM.   Still eating fast foods.   Eats at [facility] frequently.   Oct. 27, 2016:  Doing well.  Still at [employer].  Oct. 23, 2017:  Christopher Allison is seen for his 1 year office visit . Ran out of his HCTZ  several months ago Also is eating lots of salty foods.   March 03, 2016: Is doing well.   Had some prostate irritation Still eating hot dogs  Still  No CP or dyspnea Still at [employer]  Not exercising much   April 17, 2018: Christopher Allison is seen today for follow-up visit. Doing well Wt is 222 lbs Still at [employer]  No CP or dyspnea  Still eating hot dogs.   September 07, 2018 :  Christopher Allison is seen today for HTN and tachycardia  Had an allergic reaction  No CP , no dyspnea.   December 06, 2018:  Christopher Allison is seen today for follow-up of his hypertension and tachycardia. We started spironolactone at his last visit  Is not exercising Is not sleeping well - only 4-5 hours.   June, 9, 2021:  Christopher Allison is seen for follow up of his HTN Is sleeping  Perhaps a bit better .  Wt is 216  No cp , no dyspnea   November 24, 2020: Christopher Allison is seen today for follow-up of his hypertension. Having difficulty sleeping  Still very active  Still working with [employer] - but no longer  working double shifts.  Wt is 222 ( up 6 lbs from last year )    Dec. 11,2023  Christopher Allison is seen today for follow up of his HTN Diastolic CHF.  Does not eat much salt  Is on aldactone 50 mg a day      Current Outpatient Medications on File Prior to Visit  Medication Sig Dispense Refill   acetaminophen (TYLENOL) 500 MG tablet Take 500 mg by mouth every 6 (six) hours as needed for mild pain. For pain     losartan (COZAAR) 50 MG tablet Take 1 tablet (50 mg total) by mouth daily. Keep your upcoming appointment for # 90 day supply. 30 tablet 0   metoprolol tartrate (LOPRESSOR) 50 MG tablet TAKE 2 TABLETS BY MOUTH TWICE A DAY 360 tablet 1   spironolactone (ALDACTONE) 50 MG tablet TAKE 1 TABLET BY MOUTH EVERY DAY 90 tablet 0   No current facility-administered medications on file prior to visit.    Allergies  Allergen Reactions   Toprol Xl [Metoprolol Succinate] Hives and Itching    Past Medical History:  Diagnosis Date  Abdominal pain    Chest pain    Heart palpitations    Hiatal hernia    Hypertension    Plantar fasciitis    Prostatitis    Tachycardia     Past Surgical History:  Procedure Laterality Date   CARDIAC CATHETERIZATION  1993   Normal heart catheterization in [place], [place]    Social History   Tobacco Use  Smoking Status Never  Smokeless Tobacco Never    Social History   Substance and Sexual Activity  Alcohol Use No    Family History  Problem Relation Age of Onset   Hypertension Father    Diabetes Mother     Reviw of Systems:  Reviewed in the HPI.  All other systems are negative.  Physical Exam: Blood pressure 130/86, pulse 60, height 5\' 9"  (1.753 m), weight 229 lb (103.9 kg), SpO2 99 %.       GEN:  Well nourished, well developed in no acute distress HEENT: Normal NECK: No JVD; No carotid bruits LYMPHATICS: No lymphadenopathy CARDIAC: RRR , no murmurs, rubs, gallops RESPIRATORY:  Clear to auscultation without rales, wheezing or  rhonchi  ABDOMEN: Soft, non-tender, non-distended MUSCULOSKELETAL:  No edema; No deformity  SKIN: Warm and dry NEUROLOGIC:  Alert and oriented x 3   ECG: January 31, 2022: Normal sinus rhythm at 60.  No ST or T wave changes.   Assessment / Plan:    1. Hypertension:      Diastolic BP is still elevated.   Needs to exercise more, needs to lose weight .   Will add lasix 40 mg a day, Kdur 10 meq BID Office visit with February 02, 2022, NP in 3 months    2.  Family history of diabetes mellitus:   3. Obesity:   I advised more exercise, and weight loss         Eligha Bridegroom, MD  01/31/2022 4:11 PM    [facility] [address] [place],  [address] [place], [place]  [place] Pager [phone] Phone: [phone]; Fax: [phone]

## 2022-01-31 NOTE — Patient Instructions (Addendum)
Medication Instructions:  START Furosemide 40mg  daily START Potassium chloride twice daily *If you need a refill on your cardiac medications before your next appointment, please call your pharmacy*   Lab Work: BMET  02/15/22 If you have labs (blood work) drawn today and your tests are completely normal, you will receive your results only by: MyChart Message (if you have MyChart) OR A paper copy in the mail If you have any lab test that is abnormal or we need to change your treatment, we will call you to review the results.   Testing/Procedures: NONE   Follow-Up: At [facility], you and your health needs are our priority.  As part of our continuing mission to provide you with exceptional heart care, we have created designated Provider Care Teams.  These Care Teams include your primary Cardiologist (physician) and Advanced Practice Providers (APPs -  Physician Assistants and Nurse Practitioners) who all work together to provide you with the care you need, when you need it.  We recommend signing up for the patient portal called "MyChart".  Sign up information is provided on this After Visit Summary.  MyChart is used to connect with patients for Virtual Visits (Telemedicine).  Patients are able to view lab/test results, encounter notes, upcoming appointments, etc.  Non-urgent messages can be sent to your provider as well.   To learn more about what you can do with MyChart, go to [facility].    Your next appointment:   05/04/21 @ 11:20  The format for your next appointment:   In Person  Provider:   05/06/21, MD / Kristeen Miss, NP     Important Information About Sugar

## 2022-02-15 ENCOUNTER — Other Ambulatory Visit: Payer: BC Managed Care – PPO

## 2022-02-22 ENCOUNTER — Other Ambulatory Visit: Payer: Self-pay | Admitting: Cardiovascular Disease

## 2022-03-01 ENCOUNTER — Ambulatory Visit: Payer: BC Managed Care – PPO | Attending: Cardiovascular Disease

## 2022-03-01 DIAGNOSIS — Z79899 Other long term (current) drug therapy: Secondary | ICD-10-CM

## 2022-03-01 DIAGNOSIS — I10 Essential (primary) hypertension: Secondary | ICD-10-CM

## 2022-03-02 LAB — BASIC METABOLIC PANEL
BUN/Creatinine Ratio: 11 (ref 9–20)
BUN: 12 mg/dL (ref 6–24)
CO2: 24 mmol/L (ref 20–29)
Calcium: 9.9 mg/dL (ref 8.7–10.2)
Chloride: 99 mmol/L (ref 96–106)
Creatinine, Ser: 1.11 mg/dL (ref 0.76–1.27)
Glucose: 99 mg/dL (ref 70–99)
Potassium: 4.8 mmol/L (ref 3.5–5.2)
Sodium: 138 mmol/L (ref 134–144)
eGFR: 81 mL/min/{1.73_m2} (ref >59)

## 2022-05-01 NOTE — Progress Notes (Deleted)
Cardiology Office Note:    Date:  05/01/2022   ID:  Christopher Allison, DOB 1976/02/17, MRN [id number]  PCP:  Denita Lung, MD   [facility] Providers Cardiologist:  Mertie Moores, MD { Click to update primary MD,subspecialty MD or APP then REFRESH:1}    Referring MD: Denita Lung, MD   Chief Complaint: ***  History of Present Illness:    Christopher Allison is a *** 47 y.o. male with a hx of HTN, obesity, chronic HFpEF, and tachycardia.  History of normal cardiac catheterization in [facility] in 1993 . Has maintained consistent follow-up with Dr. Acie Fredrickson for > 10 years for management of hypertension.  Has worked for [employer], work double shifts at times.  Reported he had difficulty sleeping.  Last cardiology clinic visit was 01/31/2022 with Dr. Acie Fredrickson at which time physical exam consistent with chronic HFpEF.  Lasix 40 mg and K-Dur 10 mEq daily was added to antihypertensive regimen of losartan 50 mg QD, spironolactone 50 mg QD, and Lopressor 50 mg BID. He was advised to increase exercise and follow low-sodium, heart healthy diet.  He is here today for 3 month follow-up.  ASCVD risk score  Past Medical History:  Diagnosis Date   Abdominal pain    Chest pain    Heart palpitations    Hiatal hernia    Hypertension    Plantar fasciitis    Prostatitis    Tachycardia     Past Surgical History:  Procedure Laterality Date   CARDIAC CATHETERIZATION  1993   Normal heart catheterization in [place], [place]    Current Medications: No outpatient medications have been marked as taking for the 05/05/22 encounter (Appointment) with Emmaline Life, NP.     Allergies:   Toprol xl [metoprolol succinate]   Social History   Socioeconomic History   Marital status: Single    Spouse name: Not on file   Number of children: Not on file   Years of education: Not on file   Highest education level: Not on file  Occupational History   Not on file  Tobacco Use    Smoking status: Never   Smokeless tobacco: Never  Substance and Sexual Activity   Alcohol use: No   Drug use: No   Sexual activity: Not on file  Other Topics Concern   Not on file  Social History Narrative   Not on file   Social Determinants of Health   Financial Resource Strain: Not on file  Food Insecurity: Not on file  Transportation Needs: Not on file  Physical Activity: Not on file  Stress: Not on file  Social Connections: Not on file     Family History: The patient's ***family history includes Diabetes in his mother; Hypertension in his father.  ROS:   Please see the history of present illness.    *** All other systems reviewed and are negative.  Labs/Other Studies Reviewed:    The following studies were reviewed today:  Echo 05/2008 Normal LV size and function Trace MR, trace TR   Recent Labs: 03/01/2022: BUN 12; Creatinine, Ser 1.11; Potassium 4.8; Sodium 138  Recent Lipid Panel    Component Value Date/Time   CHOL 202 (H) 12/18/2014 1501   TRIG 102 12/18/2014 1501   HDL 40 12/18/2014 1501   CHOLHDL 5.1 (H) 12/18/2014 1501   VLDL 20 12/18/2014 1501   LDLCALC 142 (H) 12/18/2014 1501     Risk Assessment/Calculations:   {Does this patient have ATRIAL  FIBRILLATION?:[id number]}       Physical Exam:    VS:  There were no vitals taken for this visit.    Wt Readings from Last 3 Encounters:  01/31/22 229 lb (103.9 kg)  11/24/20 222 lb 9.6 oz (101 kg)  04/02/20 214 lb 12.8 oz (97.4 kg)     GEN: *** Well nourished, well developed in no acute distress HEENT: Normal NECK: No JVD; No carotid bruits CARDIAC: ***RRR, no murmurs, rubs, gallops RESPIRATORY:  Clear to auscultation without rales, wheezing or rhonchi  ABDOMEN: Soft, non-tender, non-distended MUSCULOSKELETAL:  No edema; No deformity. *** pedal pulses, ***bilaterally SKIN: Warm and dry NEUROLOGIC:  Alert and oriented x 3 PSYCHIATRIC:  Normal affect   EKG:  EKG is *** ordered today.  The ekg  ordered today demonstrates ***  No BP recorded.  {Refresh Note OR Click here to enter BP  :1}***    Diagnoses:    No diagnosis found. Assessment and Plan:     Chronic HFpEF: Hypertension:   {Are you ordering a CV Procedure (e.g. stress test, cath, DCCV, TEE, etc)?   Press F2        :[id number]   Disposition:  Medication Adjustments/Labs and Tests Ordered: Current medicines are reviewed at length with the patient today.  Concerns regarding medicines are outlined above.  No orders of the defined types were placed in this encounter.  No orders of the defined types were placed in this encounter.   There are no Patient Instructions on file for this visit.   Signed, Emmaline Life, NP  05/01/2022 4:24 PM    [place]

## 2022-05-05 ENCOUNTER — Ambulatory Visit: Payer: BC Managed Care – PPO | Admitting: Nurse Practitioner

## 2022-05-18 ENCOUNTER — Other Ambulatory Visit: Payer: Self-pay | Admitting: Cardiovascular Disease

## 2022-06-20 NOTE — Progress Notes (Signed)
Cardiology Office Note:    Date:  06/24/2022   ID:  Christopher Allison, DOB [birth year], MRN [id number]  PCP:  Ronnald Nian, MD   [facility] Providers Cardiologist:  Kristeen Miss, MD     Referring MD: Ronnald Nian, MD   Chief Complaint: HFpEF  History of Present Illness:    Christopher Allison is a pleasant 47 y.o. male with a hx of HTN, obesity, chronic HFpEF, and tachycardia.  History of normal cardiac catheterization in [place], [place] in 1993.  He has maintained consistent follow-up with Dr. Elease Hashimoto for > 10 years for management of hypertension.   Has worked for [employer], works double shifts at times.  Reported he had difficulty sleeping.  Last cardiology clinic visit was 01/31/2022 with Dr. Elease Hashimoto at which time physical exam was consistent with chronic HFpEF.  Lasix 40 mg and K-Dur 10 milliequivalents daily was added to antihypertensive regimen of losartan 50 mg QD, spironolactone 50 mg QD, and Lopressor 50 mg twice daily.  He was advised to increase exercise and follow low-sodium, heart healthy diet.  He is here today for 3 month follow-up. Reports he is feeling "ok." No specific complaints voiced. Sleeps with head elevated due to history of hiatal hernia, no recent change in elevation and no PND. Has not been able to sleep more than 4-5 hours in many years, thinks it is due to previously working night shift. Has been told that he snores.  He reports no problems with his medications.  Does not monitor BP at home. Admits to weight gain 2/2 dietary indiscretion. Continues to eat lot of sweets, snacks. Does not cook much. Avoids fried food because of hernia. He denies chest pain, shortness of breath, lower extremity edema, fatigue, palpitations, melena, hematuria, hemoptysis, diaphoresis, weakness, presyncope, syncope.   Past Medical History:  Diagnosis Date   Abdominal pain    Chest pain    Heart palpitations    Hiatal hernia    Hypertension    Plantar fasciitis    Prostatitis     Tachycardia     Past Surgical History:  Procedure Laterality Date   CARDIAC CATHETERIZATION  1993   Normal heart catheterization in [place], [place]    Current Medications: Current Meds  Medication Sig   acetaminophen (TYLENOL) 500 MG tablet Take 500 mg by mouth every 6 (six) hours as needed for mild pain. For pain   furosemide (LASIX) 40 MG tablet Take 1 tablet (40 mg total) by mouth daily.   losartan (COZAAR) 50 MG tablet TAKE 1 TABLET (50 MG TOTAL) BY MOUTH DAILY. KEEP YOUR UPCOMING APPOINTMENT FOR # 90 DAY SUPPLY.   metoprolol tartrate (LOPRESSOR) 50 MG tablet TAKE 2 TABLETS BY MOUTH TWICE A DAY   potassium chloride (KLOR-CON) 10 MEQ tablet Take 1 tablet (10 mEq total) by mouth 2 (two) times daily.   spironolactone (ALDACTONE) 50 MG tablet TAKE 1 TABLET BY MOUTH EVERY DAY     Allergies:   Toprol xl [metoprolol succinate]   Social History   Socioeconomic History   Marital status: Single    Spouse name: Not on file   Number of children: Not on file   Years of education: Not on file   Highest education level: Not on file  Occupational History   Not on file  Tobacco Use   Smoking status: Never   Smokeless tobacco: Never  Substance and Sexual Activity   Alcohol use: No   Drug use: No   Sexual activity: Not  on file  Other Topics Concern   Not on file  Social History Narrative   Not on file   Social Determinants of Health   Financial Resource Strain: Not on file  Food Insecurity: Not on file  Transportation Needs: Not on file  Physical Activity: Not on file  Stress: Not on file  Social Connections: Not on file     Family History: The patient's family history includes Diabetes in his mother; Hypertension in his father.  ROS:   Please see the history of present illness.   All other systems reviewed and are negative.  Labs/Other Studies Reviewed:    The following studies were reviewed today:  Echo 05/2008 Normal LV size and function Trace MR, trace TR    Recent Labs: 03/01/2022: BUN 12; Creatinine, Ser 1.11; Potassium 4.8; Sodium 138  Recent Lipid Panel    Component Value Date/Time   CHOL 202 (H) 12/18/2014 1501   TRIG 102 12/18/2014 1501   HDL 40 12/18/2014 1501   CHOLHDL 5.1 (H) 12/18/2014 1501   VLDL 20 12/18/2014 1501   LDLCALC 142 (H) 12/18/2014 1501     Risk Assessment/Calculations:      STOP-Bang Score:  5       Physical Exam:    VS:  BP 138/88   Pulse 60   Ht 5\' 9"  (1.753 m)   Wt 234 lb 6.4 oz (106.3 kg)   SpO2 97%   BMI 34.61 kg/m     Wt Readings from Last 3 Encounters:  06/24/22 234 lb 6.4 oz (106.3 kg)  01/31/22 229 lb (103.9 kg)  11/24/20 222 lb 9.6 oz (101 kg)     GEN:  Well nourished, well developed in no acute distress HEENT: Normal NECK: No JVD; No carotid bruits CARDIAC: RRR, no murmurs, rubs, gallops RESPIRATORY:  Clear to auscultation without rales, wheezing or rhonchi  ABDOMEN: Soft, non-tender, non-distended MUSCULOSKELETAL:  No edema; No deformity. 2+ pedal pulses, equal bilaterally SKIN: Warm and dry NEUROLOGIC:  Alert and oriented x 3 PSYCHIATRIC:  Normal affect   EKG:  EKG is not ordered today       Diagnoses:    1. Chronic heart failure with preserved ejection fraction (HFpEF) (HCC)   2. Essential hypertension   3. Snoring    Assessment and Plan:     Chronic HFpEF: Admits to weight gain he thinks is secondary to dietary indiscretion. Still eats a lot of junk food, does not have a kitchen he can cook in presently. No edema, orthopnea, PND, or dyspnea. No evidence of volume overload on exam. We discussed the concern that his heart function may not be normal. He wants to hold off on getting echocardiogram at this time.  Wants to work on eating a better diet and carefully monitoring his BP. I will see him back in 3 months for follow-up.  Hypertension: BP is elevated initially, improves slightly on my recheck. He does not monitor on a consistent basis. Encouraged low sodium diet and  regular exercise. Will get BMP today to ensure stable renal function.  Snoring: Does not sleep well and has been told he snores. Stop Bang score is 5. We will get an itamar home sleep study.    Disposition: 3 months with me  Medication Adjustments/Labs and Tests Ordered: Current medicines are reviewed at length with the patient today.  Concerns regarding medicines are outlined above.  Orders Placed This Encounter  Procedures   Basic metabolic panel   Itamar Sleep Study  No orders of the defined types were placed in this encounter.   Patient Instructions  Medication Instructions:  Your physician recommends that you continue on your current medications as directed. Please refer to the Current Medication list given to you today.  *If you need a refill on your cardiac medications before your next appointment, please call your pharmacy*   Lab Work: BMET If you have labs (blood work) drawn today and your tests are completely normal, you will receive your results only by: MyChart Message (if you have MyChart) OR A paper copy in the mail If you have any lab test that is abnormal or we need to change your treatment, we will call you to review the results.   Testing/Procedures: Your physician has recommended that you have a sleep study. This test records several body functions during sleep, including: brain activity, eye movement, oxygen and carbon dioxide blood levels, heart rate and rhythm, breathing rate and rhythm, the flow of air through your mouth and nose, snoring, body muscle movements, and chest and belly movement.    Follow-Up: At [facility], you and your health needs are our priority.  As part of our continuing mission to provide you with exceptional heart care, we have created designated Provider Care Teams.  These Care Teams include your primary Cardiologist (physician) and Advanced Practice Providers (APPs -  Physician Assistants and Nurse Practitioners) who all  work together to provide you with the care you need, when you need it.   Your next appointment:   3 month(s)  Provider:   Eligha Bridegroom, NP         Signed, Levi Aland, NP  06/24/2022 4:52 PM    [facility]

## 2022-06-24 ENCOUNTER — Encounter: Payer: Self-pay | Admitting: Nurse Practitioner

## 2022-06-24 ENCOUNTER — Telehealth: Payer: Self-pay | Admitting: *Deleted

## 2022-06-24 ENCOUNTER — Ambulatory Visit: Payer: BC Managed Care – PPO | Attending: Nurse Practitioner | Admitting: Nurse Practitioner

## 2022-06-24 VITALS — BP 138/88 | HR 60 | Ht 69.0 in | Wt 234.4 lb

## 2022-06-24 DIAGNOSIS — R0683 Snoring: Secondary | ICD-10-CM

## 2022-06-24 DIAGNOSIS — I5032 Chronic diastolic (congestive) heart failure: Secondary | ICD-10-CM | POA: Diagnosis not present

## 2022-06-24 DIAGNOSIS — I10 Essential (primary) hypertension: Secondary | ICD-10-CM

## 2022-06-24 NOTE — Telephone Encounter (Signed)
Pt has been seen by Eligha Bridegroom, NP today who ordered an Itamar study. Pt agreeable to signed waiver and to not open the box until he has been called with PIN#.

## 2022-06-24 NOTE — Patient Instructions (Signed)
Medication Instructions:  Your physician recommends that you continue on your current medications as directed. Please refer to the Current Medication list given to you today.  *If you need a refill on your cardiac medications before your next appointment, please call your pharmacy*   Lab Work: BMET If you have labs (blood work) drawn today and your tests are completely normal, you will receive your results only by: MyChart Message (if you have MyChart) OR A paper copy in the mail If you have any lab test that is abnormal or we need to change your treatment, we will call you to review the results.   Testing/Procedures: Your physician has recommended that you have a sleep study. This test records several body functions during sleep, including: brain activity, eye movement, oxygen and carbon dioxide blood levels, heart rate and rhythm, breathing rate and rhythm, the flow of air through your mouth and nose, snoring, body muscle movements, and chest and belly movement.    Follow-Up: At [facility] you and your health needs are our priority.  As part of our continuing mission to provide you with exceptional heart care, we have created designated Provider Care Teams.  These Care Teams include your primary Cardiologist (physician) and Advanced Practice Providers (APPs -  Physician Assistants and Nurse Practitioners) who all work together to provide you with the care you need, when you need it.   Your next appointment:   3 month(s)  Provider:   Eligha Bridegroom, NP

## 2022-06-25 LAB — BASIC METABOLIC PANEL
BUN/Creatinine Ratio: 10 (ref 9–20)
BUN: 12 mg/dL (ref 6–24)
CO2: 22 mmol/L (ref 20–29)
Calcium: 9.7 mg/dL (ref 8.7–10.2)
Chloride: 100 mmol/L (ref 96–106)
Creatinine, Ser: 1.16 mg/dL (ref 0.76–1.27)
Glucose: 115 mg/dL — ABNORMAL HIGH (ref 70–99)
Potassium: 4.5 mmol/L (ref 3.5–5.2)
Sodium: 137 mmol/L (ref 134–144)
eGFR: 77 mL/min/{1.73_m2} (ref >59)

## 2022-06-27 ENCOUNTER — Other Ambulatory Visit: Payer: Self-pay | Admitting: Cardiovascular Disease

## 2022-07-20 NOTE — Telephone Encounter (Signed)
Prior Authorization for [facility] sent to [facility] via web portal. Tracking Number . NO PA REQ-REF# [id number]. 07/20/22

## 2022-07-21 NOTE — Telephone Encounter (Signed)
I have tried to call the pt and his DPR (mother) but neither call will go through. I will send the a pt a message through [facility] CHART that he has been approved and will send PIN # [id number] thru MY CHART. I will also ask the pt to please reply that he has received the MY CHART and to proceed with sleep study. Will need to confirm ph# for the pt as I cannot get a call to go through.

## 2022-08-13 ENCOUNTER — Emergency Department (HOSPITAL_COMMUNITY)
Admission: EM | Admit: 2022-08-13 | Discharge: 2022-08-14 | Disposition: A | Discharge status: Home / Self Care | Payer: BC Managed Care – PPO | Attending: Emergency Medicine | Admitting: Emergency Medicine

## 2022-08-13 DIAGNOSIS — Z79899 Other long term (current) drug therapy: Secondary | ICD-10-CM | POA: Diagnosis not present

## 2022-08-13 DIAGNOSIS — I10 Essential (primary) hypertension: Secondary | ICD-10-CM | POA: Diagnosis not present

## 2022-08-13 DIAGNOSIS — R42 Dizziness and giddiness: Secondary | ICD-10-CM | POA: Diagnosis present

## 2022-08-13 NOTE — ED Triage Notes (Signed)
Pt arrived via [facility] c/o multiple dizzy spells noted with positional changes. Pt states that he has been with out his medication for about a week, lorsartan, and wonders if this is causing the dizziness

## 2022-08-14 ENCOUNTER — Encounter (HOSPITAL_COMMUNITY): Payer: Self-pay

## 2022-08-14 ENCOUNTER — Other Ambulatory Visit: Payer: Self-pay

## 2022-08-14 LAB — CBC
HCT: 46.6 % (ref 39.0–52.0)
Hemoglobin: 15.7 g/dL (ref 13.0–17.0)
MCH: 27.4 pg (ref 26.0–34.0)
MCHC: 33.7 g/dL (ref 30.0–36.0)
MCV: 81.2 fL (ref 80.0–100.0)
Platelets: 258 10*3/uL (ref 150–400)
RBC: 5.74 MIL/uL (ref 4.22–5.81)
RDW: 13.6 % (ref 11.5–15.5)
WBC: 7 10*3/uL (ref 4.0–10.5)
nRBC: 0 % (ref 0.0–0.2)

## 2022-08-14 LAB — BASIC METABOLIC PANEL
Anion gap: 10 (ref 5–15)
BUN: 12 mg/dL (ref 6–20)
CO2: 24 mmol/L (ref 22–32)
Calcium: 9.5 mg/dL (ref 8.9–10.3)
Chloride: 101 mmol/L (ref 98–111)
Creatinine, Ser: 1.22 mg/dL (ref 0.61–1.24)
GFR, Estimated: >60 mL/min (ref >60)
Glucose, Bld: 133 mg/dL — ABNORMAL HIGH (ref 70–99)
Potassium: 3.5 mmol/L (ref 3.5–5.1)
Sodium: 135 mmol/L (ref 135–145)

## 2022-08-14 NOTE — ED Provider Notes (Signed)
[place] EMERGENCY DEPARTMENT AT [facility] Provider Note   CSN: [id number] Arrival date & time: 08/13/22  2312     History  Chief Complaint  Patient presents with   Dizziness    Christopher Allison is a 47 y.o. male with past medical history significant for hypertension presents to the ED complaining of multiple dizzy spells that happen with positional changes.  Patient states he had a couple of episodes when he went from sitting to standing.  He has been off of his losartan for the past 10 days due to being on vacation.  He has plans to go pick this medication up soon.  Patient states he did have an episode while in the waiting room, but is not currently having symptoms.  Denies headache, syncope, weakness, nausea, vomiting, visual disturbance.       Home Medications Prior to Admission medications   Medication Sig Start Date End Date Taking? Authorizing Provider  acetaminophen (TYLENOL) 500 MG tablet Take 500 mg by mouth every 6 (six) hours as needed for mild pain. For pain    [provider]  furosemide (LASIX) 40 MG tablet Take 1 tablet (40 mg total) by mouth daily. 01/31/22   Nahser, Deloris Ping, MD  losartan (COZAAR) 50 MG tablet TAKE 1 TABLET (50 MG TOTAL) BY MOUTH DAILY. KEEP YOUR UPCOMING APPOINTMENT FOR # 90 DAY SUPPLY. 02/22/22   Nahser, Deloris Ping, MD  metoprolol tartrate (LOPRESSOR) 50 MG tablet TAKE 2 TABLETS BY MOUTH TWICE A DAY 05/18/22   Nahser, Deloris Ping, MD  potassium chloride (KLOR-CON) 10 MEQ tablet Take 1 tablet (10 mEq total) by mouth 2 (two) times daily. 01/31/22   Nahser, Deloris Ping, MD  spironolactone (ALDACTONE) 50 MG tablet TAKE 1 TABLET BY MOUTH EVERY DAY 06/27/22   Swinyer, Zachary George, NP      Allergies    Toprol xl [metoprolol succinate]    Review of Systems   Review of Systems  Eyes:  Negative for visual disturbance.  Gastrointestinal:  Negative for nausea and vomiting.  Neurological:  Positive for dizziness. Negative for syncope, weakness  and headaches.    Physical Exam Updated Vital Signs BP (!) 141/88 (BP Location: Right Arm)   Pulse 69   Temp 97.9 F (36.6 C) (Oral)   Resp 20   SpO2 100%  Physical Exam Vitals and nursing note reviewed.  Constitutional:      General: He is not in acute distress.    Appearance: Normal appearance. He is not ill-appearing or diaphoretic.  Cardiovascular:     Rate and Rhythm: Normal rate and regular rhythm.  Pulmonary:     Effort: Pulmonary effort is normal.  Musculoskeletal:     Cervical back: Full passive range of motion without pain.  Neurological:     General: No focal deficit present.     Mental Status: He is alert. Mental status is at baseline.     Cranial Nerves: Cranial nerves 2-12 are intact.     Sensory: Sensation is intact.     Motor: Motor function is intact.     Coordination: Coordination is intact.     Gait: Gait is intact.     Comments: Cranial Nerves:  II: peripheral fields grossly intact III,IV, VI: ptosis not present, extra-ocular movements intact bilaterally, direct and consensual pupillary light reflexes intact bilaterally V: facial sensation, jaw opening, and bite strength equal bilaterally VII: eyebrow raise, eyelid close, smile, frown, pucker equal bilaterally VIII: hearing grossly normal bilaterally  IX,X:  palate elevation and swallowing intact XI: bilateral shoulder shrug and lateral head rotation equal and strong XII: midline tongue extension Motor: 5/5 strength in BUE and BLE  Sensation grossly intact  Psychiatric:        Mood and Affect: Mood normal.        Behavior: Behavior normal.     ED Results / Procedures / Treatments   Labs (all labs ordered are listed, but only abnormal results are displayed) Labs Reviewed  BASIC METABOLIC PANEL - Abnormal; Notable for the following components:      Result Value   Glucose, Bld 133 (*)    All other components within normal limits  CBC    EKG None  Radiology No results  found.  Procedures Procedures    Medications Ordered in ED Medications - No data to display  ED Course/ Medical Decision Making/ A&P                             Medical Decision Making Amount and/or Complexity of Data Reviewed Labs: ordered.   This patient presents to the ED with chief complaint(s) of dizziness with positional changes with pertinent past medical history of hypertension.  The complaint involves an extensive differential diagnosis and also carries with it a high risk of complications and morbidity.    The differential diagnosis includes vertigo, metabolic derangement, orthostatic hypotension, dehydration, medication adverse effect  The initial plan is to obtain baseline labs and EKG  Initial Assessment:   Exam significant for overall well-appearing patient is not in acute distress.  Neuroexam is unremarkable.  CN II through XII grossly intact.  Gait intact.  Patient not currently complaining of dizziness or other symptoms.  Independent ECG/labs interpretation:  The following labs were independently interpreted:  CBC without leukocytosis or anemia.  Metabolic panel without electrolyte derangement.  Renal function is normal.  ECG demonstrates sinus rhythm without acute infarction or ischemia.  Disposition:   Patient's workup is reassuring and he is currently asymptomatic.  Patient has plans to pick up his losartan.  Advised patient to follow-up with his PCP.  The patient has been appropriately medically screened and/or stabilized in the ED. I have low suspicion for any other emergent medical condition which would require further screening, evaluation or treatment in the ED or require inpatient management. At time of discharge the patient is hemodynamically stable and in no acute distress. I have discussed work-up results and diagnosis with patient and answered all questions. Patient is agreeable with discharge plan. We discussed strict return precautions for returning to  the emergency department and they verbalized understanding.           Final Clinical Impression(s) / ED Diagnoses Final diagnoses:  Dizziness    Rx / DC Orders ED Discharge Orders     None         Lenard Simmer, PA-C 08/14/22 0741    Wynetta Fines, MD 08/14/22 [phone]

## 2022-08-14 NOTE — Discharge Instructions (Signed)
Thank you for allowing me to be a part of your care today. You were evaluated in the ED for dizziness.    Your workup today is overall reassuring.  Your dizziness may be related to suddenly stopping your blood pressure medication.  I recommend starting your medication back when you are able to and following up with your primary care provider.    Return to the ED if you develop sudden worsening of your symptoms or if you have any new concerns.

## 2022-09-15 NOTE — Progress Notes (Signed)
Cardiology Office Note:    Date:  09/23/2022   ID:  Christopher Allison, DOB 04/29/1975, MRN [id number]  PCP:  Ronnald Nian, MD   [facility] Providers Cardiologist:  Kristeen Miss, MD     Referring MD: Ronnald Nian, MD   Chief Complaint: HFpEF  History of Present Illness:    Christopher Allison is a pleasant 47 y.o. male with a hx of HTN, obesity, chronic HFpEF, and tachycardia.  History of normal cardiac catheterization in [place], [place] in 1993.  He has maintained consistent follow-up with Dr. Elease Hashimoto for > 10 years for management of hypertension.   Has worked for [employer], works double shifts at times.  Reported he had difficulty sleeping.  Last cardiology clinic visit was 01/31/2022 with Dr. Elease Hashimoto at which time physical exam was consistent with chronic HFpEF.  Lasix 40 mg and K-Dur 10 milliequivalents daily was added to antihypertensive regimen of losartan 50 mg QD, spironolactone 50 mg QD, and Lopressor 50 mg twice daily.  He was advised to increase exercise and follow low-sodium, heart healthy diet.  Seen by me on 06/24/22 for 3 month follow-up. Reported feeling "ok." No specific complaints voiced. Sleeps with head elevated due to history of hiatal hernia, no recent change in elevation and no PND. Has not been able to sleep more than 4-5 hours in many years, thinks it is due to previously working night shift. Has been told that he snores. No problems with his medications. Does not monitor BP at home. Admits to weight gain 2/2 dietary indiscretion. Admitted to poor diet, high in sugar and sodium. Does not cook much. Avoids fried food because of hernia. He denied chest pain, shortness of breath, lower extremity edema, fatigue, palpitations, melena, hematuria, hemoptysis, diaphoresis, weakness, presyncope, syncope. Was advised to undergo sleep study for evaluation of OSA and STOP-BANG score of 5 but there is no record that has been completed.  We discussed that weight gain could be attributed  to decreased LV function.  He elected to hold off on getting echocardiogram at this time and wanted to work on better diet and exercise.  Today, he is here alone for follow-up. He went on vacation and did not have his medication with him. Went to ED for dizziness and blood pressure was quite elevated.  We discussed ways to ensure he does not run out of medication in the future.  He is feeling well.  He denies chest pain, shortness of breath, edema, orthopnea, PND, palpitations, presyncope, syncope.  Past Medical History:  Diagnosis Date   Abdominal pain    Chest pain    Heart palpitations    Hiatal hernia    Hypertension    Plantar fasciitis    Prostatitis    Tachycardia     Past Surgical History:  Procedure Laterality Date   CARDIAC CATHETERIZATION  1993   Normal heart catheterization in [place], [place]    Current Medications: Current Meds  Medication Sig   acetaminophen (TYLENOL) 500 MG tablet Take 500 mg by mouth every 6 (six) hours as needed for mild pain. For pain   [DISCONTINUED] furosemide (LASIX) 40 MG tablet Take 1 tablet (40 mg total) by mouth daily.   [DISCONTINUED] losartan (COZAAR) 50 MG tablet TAKE 1 TABLET (50 MG TOTAL) BY MOUTH DAILY. KEEP YOUR UPCOMING APPOINTMENT FOR # 90 DAY SUPPLY.   [DISCONTINUED] metoprolol tartrate (LOPRESSOR) 50 MG tablet TAKE 2 TABLETS BY MOUTH TWICE A DAY   [DISCONTINUED] potassium chloride (KLOR-CON) 10 MEQ  tablet Take 1 tablet (10 mEq total) by mouth 2 (two) times daily.   [DISCONTINUED] spironolactone (ALDACTONE) 50 MG tablet TAKE 1 TABLET BY MOUTH EVERY DAY     Allergies:   Toprol xl [metoprolol succinate]   Social History   Socioeconomic History   Marital status: Single    Spouse name: Not on file   Number of children: Not on file   Years of education: Not on file   Highest education level: Not on file  Occupational History   Not on file  Tobacco Use   Smoking status: Never   Smokeless tobacco: Never  Substance and Sexual  Activity   Alcohol use: No   Drug use: No   Sexual activity: Not on file  Other Topics Concern   Not on file  Social History Narrative   Not on file   Social Determinants of Health   Financial Resource Strain: Not on file  Food Insecurity: Not on file  Transportation Needs: Not on file  Physical Activity: Not on file  Stress: Not on file  Social Connections: Not on file     Family History: The patient's family history includes Diabetes in his mother; Hypertension in his father.  ROS:   Please see the history of present illness.   All other systems reviewed and are negative.   Labs/Other Studies Reviewed:    The following studies were reviewed today:  Echo 05/2008 Normal LV size and function Trace MR, trace TR   Recent Labs: 08/14/2022: BUN 12; Creatinine, Ser 1.22; Hemoglobin 15.7; Platelets 258; Potassium 3.5; Sodium 135  Recent Lipid Panel    Component Value Date/Time   CHOL 202 (H) 12/18/2014 1501   TRIG 102 12/18/2014 1501   HDL 40 12/18/2014 1501   CHOLHDL 5.1 (H) 12/18/2014 1501   VLDL 20 12/18/2014 1501   LDLCALC 142 (H) 12/18/2014 1501     Risk Assessment/Calculations:      STOP-Bang Score:  5       Physical Exam:    VS:  BP (!) 132/90   Pulse 65   Ht 5' 9.5" (1.765 m)   Wt 229 lb (103.9 kg)   SpO2 98%   BMI 33.33 kg/m     Wt Readings from Last 3 Encounters:  09/23/22 229 lb (103.9 kg)  06/24/22 234 lb 6.4 oz (106.3 kg)  01/31/22 229 lb (103.9 kg)     GEN:  Well nourished, well developed in no acute distress HEENT: Normal NECK: No JVD; No carotid bruits CARDIAC: RRR, no murmurs, rubs, gallops RESPIRATORY:  Clear to auscultation without rales, wheezing or rhonchi  ABDOMEN: Soft, non-tender, non-distended MUSCULOSKELETAL:  No edema; No deformity. 2+ pedal pulses, equal bilaterally SKIN: Warm and dry NEUROLOGIC:  Alert and oriented x 3 PSYCHIATRIC:  Normal affect   EKG:  EKG is not ordered today  HYPERTENSION CONTROL Vitals:    09/23/22 1446 09/23/22 1534  BP: (!) 140/98 (!) 132/90    The patient's blood pressure is elevated above target today.  In order to address the patient's elevated BP: Blood pressure will be monitored at home to determine if medication changes need to be made.       Diagnoses:    1. Chronic heart failure with preserved ejection fraction (HFpEF) (HCC)   2. Essential hypertension   3. Medication management   4. Snoring     Assessment and Plan:     Chronic HFpEF:  No recent echo to review. He has lost a few pounds.  Is avoiding high sodium foods, fried food. Admits he needs to reduce intake of sugar and starches. He denies edema, orthopnea, PND, or dyspnea. No evidence of volume overload on exam. We discussed the concern that his heart function may not be normal. He wants to hold off on getting echocardiogram at this time. Encouraged better BP control. Stable renal function on BMET 08/14/22. Will ensure refills are available. Continue furosemide, losartan, spironolactone, metoprolol, potassium chloride.   Hypertension: BP is elevated initially, improved slightly on my recheck.Emphasized the importance of medication compliance and good BP control on heart health. Continue low sodium diet. No changes today.  Snoring: Stop Bang score is 5.  He has an [place] sleep test at home but has not completed it.  Advised him that it is approved by insurance and asked him to go ahead and complete the test and return to [place] the following day. He verbalized agreement.   Medication management: He recently ran out of his medications and went on vacation without refills.  Medication bag and refills provided. Encouraged him to contact us in the future if he has any problems.     Disposition: 6 months with Dr. Elease Hashimoto or me  Medication Adjustments/Labs and Tests Ordered: Current medicines are reviewed at length with the patient today.  Concerns regarding medicines are outlined above.  No orders of the defined  types were placed in this encounter.  Meds ordered this encounter  Medications   furosemide (LASIX) 40 MG tablet    Sig: Take 1 tablet (40 mg total) by mouth daily.    Dispense:  90 tablet    Refill:  3   potassium chloride (KLOR-CON) 10 MEQ tablet    Sig: Take 1 tablet (10 mEq total) by mouth 2 (two) times daily.    Dispense:  180 tablet    Refill:  3   losartan (COZAAR) 50 MG tablet    Sig: Take 1 tablet (50 mg total) by mouth daily. Keep your upcoming appointment for # 90 day supply.    Dispense:  90 tablet    Refill:  3   metoprolol tartrate (LOPRESSOR) 50 MG tablet    Sig: Take 2 tablets (100 mg total) by mouth 2 (two) times daily.    Dispense:  360 tablet    Refill:  3   spironolactone (ALDACTONE) 50 MG tablet    Sig: Take 1 tablet (50 mg total) by mouth daily.    Dispense:  90 tablet    Refill:  3    Patient Instructions  Medication Instructions:   Your physician recommends that you continue on your current medications as directed. Please refer to the Current Medication list given to you today.   *If you need a refill on your cardiac medications before your next appointment, please call your pharmacy*   Lab Work:  None ordered.  If you have labs (blood work) drawn today and your tests are completely normal, you will receive your results only by: MyChart Message (if you have MyChart) OR A paper copy in the mail If you have any lab test that is abnormal or we need to change your treatment, we will call you to review the results.   Testing/Procedures:  Your physician has recommended that you have a sleep study. This test records several body functions during sleep, including: brain activity, eye movement, oxygen and carbon dioxide blood levels, heart rate and rhythm, breathing rate and rhythm, the flow of air through your mouth and nose, snoring, body  muscle movements, and chest and belly movement PLEASE DO YOU SLEEP STUDY!!!   Follow-Up: At [facility], you and your health needs are our priority.  As part of our continuing mission to provide you with exceptional heart care, we have created designated Provider Care Teams.  These Care Teams include your primary Cardiologist (physician) and Advanced Practice Providers (APPs -  Physician Assistants and Nurse Practitioners) who all work together to provide you with the care you need, when you need it.  We recommend signing up for the patient portal called "MyChart".  Sign up information is provided on this After Visit Summary.  MyChart is used to connect with patients for Virtual Visits (Telemedicine).  Patients are able to view lab/test results, encounter notes, upcoming appointments, etc.  Non-urgent messages can be sent to your provider as well.   To learn more about what you can do with MyChart, go to [email or web address].    Your next appointment:   6 month(s)  Provider:   Kristeen Miss, MD     Other Instructions  Your physician wants you to follow-up in: 6 months. You will receive a reminder letter in the mail two months in advance. If you don't receive a letter, please call our office to schedule the follow-up appointment.   The [place]:       Mediterranean Diet  Why follow it? Research shows. Those who follow the Mediterranean diet have a reduced risk of heart disease  The diet is associated with a reduced incidence of Parkinson's and Alzheimer's diseases People following the diet may have longer life expectancies and lower rates of chronic diseases  The Dietary Guidelines for Americans recommends the Mediterranean diet as an eating plan to promote health and prevent disease  What Is the Mediterranean Diet?  Healthy eating plan based on typical foods and recipes of Mediterranean-style cooking The diet is primarily a plant based diet; these foods should make up a majority of meals   Starches - Plant based foods should make up a majority of meals - They are an  important sources of vitamins, minerals, energy, antioxidants, and fiber - Choose whole grains, foods high in fiber and minimally processed items  - Typical grain sources include wheat, oats, barley, corn, brown rice, bulgar, farro, millet, polenta, couscous  - Various types of beans include chickpeas, lentils, fava beans, black beans, white beans   Fruits  Veggies - Large quantities of antioxidant rich fruits & veggies; 6 or more servings  - Vegetables can be eaten raw or lightly drizzled with oil and cooked  - Vegetables common to the traditional Mediterranean Diet include: artichokes, arugula, beets, broccoli, brussel sprouts, cabbage, carrots, celery, collard greens, cucumbers, eggplant, kale, leeks, lemons, lettuce, mushrooms, okra, onions, peas, peppers, potatoes, pumpkin, radishes, rutabaga, shallots, spinach, sweet potatoes, turnips, zucchini - Fruits common to the Mediterranean Diet include: apples, apricots, avocados, cherries, clementines, dates, figs, grapefruits, grapes, melons, nectarines, oranges, peaches, pears, pomegranates, strawberries, tangerines  Fats - Replace butter and margarine with healthy oils, such as olive oil, canola oil, and tahini  - Limit nuts to no more than a handful a day  - Nuts include walnuts, almonds, pecans, pistachios, pine nuts  - Limit or avoid candied, honey roasted or heavily salted nuts - Olives are central to the Mediterranean diet - can be eaten whole or used in a variety of dishes   Meats Protein - Limiting red meat: no more than a few times a month - When eating red  meat: choose lean cuts and keep the portion to the size of deck of cards - Eggs: approx. 0 to 4 times a week  - Fish and lean poultry: at least 2 a week  - Healthy protein sources include, chicken, [place], lean beef, lamb - Increase intake of seafood such as tuna, salmon, trout, mackerel, shrimp, scallops - Avoid or limit high fat processed meats such as sausage and bacon  Dairy -  Include moderate amounts of low fat dairy products  - Focus on healthy dairy such as fat free yogurt, skim milk, low or reduced fat cheese - Limit dairy products higher in fat such as whole or 2% milk, cheese, ice cream  Alcohol - Moderate amounts of red wine is ok  - No more than 5 oz daily for women (all ages) and men older than age 22  - No more than 10 oz of wine daily for men younger than 70  Other - Limit sweets and other desserts  - Use herbs and spices instead of salt to flavor foods  - Herbs and spices common to the traditional Mediterranean Diet include: basil, bay leaves, chives, cloves, cumin, fennel, garlic, lavender, marjoram, mint, oregano, parsley, pepper, rosemary, sage, savory, sumac, tarragon, thyme   It's not just a diet, it's a lifestyle:  The Mediterranean diet includes lifestyle factors typical of those in the region  Foods, drinks and meals are best eaten with others and savored Daily physical activity is important for overall good health This could be strenuous exercise like running and aerobics This could also be more leisurely activities such as walking, housework, yard-work, or taking the stairs Moderation is the key; a balanced and healthy diet accommodates most foods and drinks Consider portion sizes and frequency of consumption of certain foods   Meal Ideas & Options:  Breakfast:  Whole wheat toast or whole wheat English muffins with peanut butter & hard boiled egg Steel cut oats topped with apples & cinnamon and skim milk  Fresh fruit: banana, strawberries, melon, berries, peaches  Smoothies: strawberries, bananas, greek yogurt, peanut butter Low fat greek yogurt with blueberries and granola  Egg white omelet with spinach and mushrooms Breakfast couscous: whole wheat couscous, apricots, skim milk, cranberries  Sandwiches:  Hummus and grilled vegetables (peppers, zucchini, squash) on whole wheat bread   Grilled chicken on whole wheat pita with lettuce,  tomatoes, cucumbers or tzatziki  [place] salad on whole wheat bread: tuna salad made with greek yogurt, olives, red peppers, capers, green onions Garlic rosemary lamb pita: lamb sauted with garlic, rosemary, salt & pepper; add lettuce, cucumber, greek yogurt to pita - flavor with lemon juice and black pepper  Seafood:  Mediterranean grilled salmon, seasoned with garlic, basil, parsley, lemon juice and black pepper Shrimp, lemon, and spinach whole-grain pasta salad made with low fat greek yogurt  Seared scallops with lemon orzo  Seared tuna steaks seasoned salt, pepper, coriander topped with tomato mixture of olives, tomatoes, olive oil, minced garlic, parsley, green onions and cappers  Meats:  Herbed greek chicken salad with kalamata olives, cucumber, feta  Red bell peppers stuffed with spinach, bulgur, lean ground beef (or lentils) & topped with feta   Kebabs: skewers of chicken, tomatoes, onions, zucchini, squash  [place] burgers: made with red onions, mint, dill, lemon juice, feta cheese topped with roasted red peppers Vegetarian Cucumber salad: cucumbers, artichoke hearts, celery, red onion, feta cheese, tossed in olive oil & lemon juice  Hummus and whole grain pita points with a  greek salad (lettuce, tomato, feta, olives, cucumbers, red onion) Lentil soup with celery, carrots made with vegetable broth, garlic, salt and pepper  Tabouli salad: parsley, bulgur, mint, scallions, cucumbers, tomato, radishes, lemon juice, olive oil, salt and pepper.      Signed, Levi Aland, NP  09/23/2022 3:40 PM    [facility]

## 2022-09-23 ENCOUNTER — Encounter: Payer: Self-pay | Admitting: Nurse Practitioner

## 2022-09-23 ENCOUNTER — Ambulatory Visit: Payer: BC Managed Care – PPO | Attending: Nurse Practitioner | Admitting: Nurse Practitioner

## 2022-09-23 VITALS — BP 132/90 | HR 65 | Ht 69.5 in | Wt 229.0 lb

## 2022-09-23 DIAGNOSIS — R0683 Snoring: Secondary | ICD-10-CM | POA: Diagnosis not present

## 2022-09-23 DIAGNOSIS — Z79899 Other long term (current) drug therapy: Secondary | ICD-10-CM

## 2022-09-23 DIAGNOSIS — I10 Essential (primary) hypertension: Secondary | ICD-10-CM | POA: Diagnosis not present

## 2022-09-23 DIAGNOSIS — I5032 Chronic diastolic (congestive) heart failure: Secondary | ICD-10-CM | POA: Diagnosis not present

## 2022-09-23 MED ORDER — POTASSIUM CHLORIDE ER 10 MEQ PO TBCR
10.0000 meq | EXTENDED_RELEASE_TABLET | Freq: Two times a day (BID) | ORAL | 3 refills | Status: AC
Start: 2022-09-23 — End: ?

## 2022-09-23 MED ORDER — SPIRONOLACTONE 50 MG PO TABS: 50.0000 mg | ORAL_TABLET | Freq: Every day | ORAL | 3 refills | Status: DC

## 2022-09-23 MED ORDER — LOSARTAN POTASSIUM 50 MG PO TABS: 50.0000 mg | ORAL_TABLET | Freq: Every day | ORAL | 3 refills | Status: DC

## 2022-09-23 MED ORDER — FUROSEMIDE 40 MG PO TABS
40.0000 mg | ORAL_TABLET | Freq: Every day | ORAL | 3 refills | Status: DC
Start: 2022-09-23 — End: 2023-03-23

## 2022-09-23 MED ORDER — METOPROLOL TARTRATE 50 MG PO TABS: 100.0000 mg | ORAL_TABLET | Freq: Two times a day (BID) | ORAL | 3 refills | Status: DC

## 2022-09-23 NOTE — Patient Instructions (Signed)
Medication Instructions:   Your physician recommends that you continue on your current medications as directed. Please refer to the Current Medication list given to you today.   *If you need a refill on your cardiac medications before your next appointment, please call your pharmacy*   Lab Work:  None ordered.  If you have labs (blood work) drawn today and your tests are completely normal, you will receive your results only by: MyChart Message (if you have MyChart) OR A paper copy in the mail If you have any lab test that is abnormal or we need to change your treatment, we will call you to review the results.   Testing/Procedures:  Your physician has recommended that you have a sleep study. This test records several body functions during sleep, including: brain activity, eye movement, oxygen and carbon dioxide blood levels, heart rate and rhythm, breathing rate and rhythm, the flow of air through your mouth and nose, snoring, body muscle movements, and chest and belly movement PLEASE DO YOU SLEEP STUDY!!!   Follow-Up: At [facility], you and your health needs are our priority.  As part of our continuing mission to provide you with exceptional heart care, we have created designated Provider Care Teams.  These Care Teams include your primary Cardiologist (physician) and Advanced Practice Providers (APPs -  Physician Assistants and Nurse Practitioners) who all work together to provide you with the care you need, when you need it.  We recommend signing up for the patient portal called "MyChart".  Sign up information is provided on this After Visit Summary.  MyChart is used to connect with patients for Virtual Visits (Telemedicine).  Patients are able to view lab/test results, encounter notes, upcoming appointments, etc.  Non-urgent messages can be sent to your provider as well.   To learn more about what you can do with MyChart, go to [email or web address].    Your next  appointment:   6 month(s)  Provider:   Kristeen Miss, MD     Other Instructions  Your physician wants you to follow-up in: 6 months. You will receive a reminder letter in the mail two months in advance. If you don't receive a letter, please call our office to schedule the follow-up appointment.   The [place]:       Mediterranean Diet  Why follow it? Research shows. Those who follow the Mediterranean diet have a reduced risk of heart disease  The diet is associated with a reduced incidence of Parkinson's and Alzheimer's diseases People following the diet may have longer life expectancies and lower rates of chronic diseases  The Dietary Guidelines for Americans recommends the Mediterranean diet as an eating plan to promote health and prevent disease  What Is the Mediterranean Diet?  Healthy eating plan based on typical foods and recipes of Mediterranean-style cooking The diet is primarily a plant based diet; these foods should make up a majority of meals   Starches - Plant based foods should make up a majority of meals - They are an important sources of vitamins, minerals, energy, antioxidants, and fiber - Choose whole grains, foods high in fiber and minimally processed items  - Typical grain sources include wheat, oats, barley, corn, brown rice, bulgar, farro, millet, polenta, couscous  - Various types of beans include chickpeas, lentils, fava beans, black beans, white beans   Fruits  Veggies - Large quantities of antioxidant rich fruits & veggies; 6 or more servings  - Vegetables can be eaten raw or lightly drizzled  with oil and cooked  - Vegetables common to the traditional Mediterranean Diet include: artichokes, arugula, beets, broccoli, brussel sprouts, cabbage, carrots, celery, collard greens, cucumbers, eggplant, kale, leeks, lemons, lettuce, mushrooms, okra, onions, peas, peppers, potatoes, pumpkin, radishes, rutabaga, shallots, spinach, sweet potatoes, turnips, zucchini -  Fruits common to the Mediterranean Diet include: apples, apricots, avocados, cherries, clementines, dates, figs, grapefruits, grapes, melons, nectarines, oranges, peaches, pears, pomegranates, strawberries, tangerines  Fats - Replace butter and margarine with healthy oils, such as olive oil, canola oil, and tahini  - Limit nuts to no more than a handful a day  - Nuts include walnuts, almonds, pecans, pistachios, pine nuts  - Limit or avoid candied, honey roasted or heavily salted nuts - Olives are central to the [employer] - can be eaten whole or used in a variety of dishes   Meats Protein - Limiting red meat: no more than a few times a month - When eating red meat: choose lean cuts and keep the portion to the size of deck of cards - Eggs: approx. 0 to 4 times a week  - Fish and lean poultry: at least 2 a week  - Healthy protein sources include, chicken, [place], lean beef, lamb - Increase intake of seafood such as tuna, salmon, trout, mackerel, shrimp, scallops - Avoid or limit high fat processed meats such as sausage and bacon  Dairy - Include moderate amounts of low fat dairy products  - Focus on healthy dairy such as fat free yogurt, skim milk, low or reduced fat cheese - Limit dairy products higher in fat such as whole or 2% milk, cheese, ice cream  Alcohol - Moderate amounts of red wine is ok  - No more than 5 oz daily for women (all ages) and men older than age 39  - No more than 10 oz of wine daily for men younger than 82  Other - Limit sweets and other desserts  - Use herbs and spices instead of salt to flavor foods  - Herbs and spices common to the traditional Mediterranean Diet include: basil, bay leaves, chives, cloves, cumin, fennel, garlic, lavender, marjoram, mint, oregano, parsley, pepper, rosemary, sage, savory, sumac, tarragon, thyme   It's not just a diet, it's a lifestyle:  The Mediterranean diet includes lifestyle factors typical of those in the region  Foods,  drinks and meals are best eaten with others and savored Daily physical activity is important for overall good health This could be strenuous exercise like running and aerobics This could also be more leisurely activities such as walking, housework, yard-work, or taking the stairs Moderation is the key; a balanced and healthy diet accommodates most foods and drinks Consider portion sizes and frequency of consumption of certain foods   Meal Ideas & Options:  Breakfast:  Whole wheat toast or whole wheat English muffins with peanut butter & hard boiled egg Steel cut oats topped with apples & cinnamon and skim milk  Fresh fruit: banana, strawberries, melon, berries, peaches  Smoothies: strawberries, bananas, greek yogurt, peanut butter Low fat greek yogurt with blueberries and granola  Egg white omelet with spinach and mushrooms Breakfast couscous: whole wheat couscous, apricots, skim milk, cranberries  Sandwiches:  Hummus and grilled vegetables (peppers, zucchini, squash) on whole wheat bread   Grilled chicken on whole wheat pita with lettuce, tomatoes, cucumbers or tzatziki  [place] salad on whole wheat bread: tuna salad made with greek yogurt, olives, red peppers, capers, green onions Garlic rosemary lamb pita: lamb sauted with  garlic, rosemary, salt & pepper; add lettuce, cucumber, greek yogurt to pita - flavor with lemon juice and black pepper  Seafood:  Mediterranean grilled salmon, seasoned with garlic, basil, parsley, lemon juice and black pepper Shrimp, lemon, and spinach whole-grain pasta salad made with low fat greek yogurt  Seared scallops with lemon orzo  Seared tuna steaks seasoned salt, pepper, coriander topped with tomato mixture of olives, tomatoes, olive oil, minced garlic, parsley, green onions and cappers  Meats:  Herbed greek chicken salad with kalamata olives, cucumber, feta  Red bell peppers stuffed with spinach, bulgur, lean ground beef (or lentils) & topped with feta    Kebabs: skewers of chicken, tomatoes, onions, zucchini, squash  [place] burgers: made with red onions, mint, dill, lemon juice, feta cheese topped with roasted red peppers Vegetarian Cucumber salad: cucumbers, artichoke hearts, celery, red onion, feta cheese, tossed in olive oil & lemon juice  Hummus and whole grain pita points with a greek salad (lettuce, tomato, feta, olives, cucumbers, red onion) Lentil soup with celery, carrots made with vegetable broth, garlic, salt and pepper  Tabouli salad: parsley, bulgur, mint, scallions, cucumbers, tomato, radishes, lemon juice, olive oil, salt and pepper.

## 2023-03-23 ENCOUNTER — Telehealth: Payer: Self-pay | Admitting: Cardiovascular Disease

## 2023-03-23 ENCOUNTER — Other Ambulatory Visit: Payer: Self-pay

## 2023-03-23 DIAGNOSIS — Z79899 Other long term (current) drug therapy: Secondary | ICD-10-CM

## 2023-03-23 DIAGNOSIS — I10 Essential (primary) hypertension: Secondary | ICD-10-CM

## 2023-03-23 MED ORDER — METOPROLOL TARTRATE 50 MG PO TABS: 100.0000 mg | ORAL_TABLET | Freq: Two times a day (BID) | ORAL | 2 refills | Status: DC

## 2023-03-23 MED ORDER — FUROSEMIDE 40 MG PO TABS
40.0000 mg | ORAL_TABLET | Freq: Every day | ORAL | 2 refills | Status: AC
Start: 2023-03-23 — End: ?

## 2023-03-23 NOTE — Telephone Encounter (Signed)
Pt's medication was sent to pt's pharmacy as requested. Confirmation received.

## 2023-03-23 NOTE — Telephone Encounter (Signed)
*  STAT* If patient is at the pharmacy, call can be transferred to refill team.   1. Which medications need to be refilled? (please list name of each medication and dose if known)   furosemide (LASIX) 40 MG tablet    2. Which pharmacy/location (including street and city if local pharmacy) is medication to be sent to?  [id number] - [place],  - [address]      3. Do they need a 30 day or 90 day supply? 90 day    Pt lost his medication so he doesn't have any.

## 2023-04-18 ENCOUNTER — Encounter: Payer: Self-pay | Admitting: Internal Medicine

## 2023-07-27 ENCOUNTER — Ambulatory Visit: Admitting: Cardiovascular Disease

## 2023-09-06 ENCOUNTER — Telehealth: Payer: Self-pay | Admitting: Nurse Practitioner

## 2023-09-06 MED ORDER — METOPROLOL TARTRATE 50 MG PO TABS: 100.0000 mg | ORAL_TABLET | Freq: Two times a day (BID) | ORAL | 0 refills | Status: DC

## 2023-09-06 NOTE — Telephone Encounter (Signed)
 Pt's medication was sent to pt's pharmacy as requested. Confirmation received.

## 2023-09-06 NOTE — Telephone Encounter (Signed)
*  STAT* If patient is at the pharmacy, call can be transferred to refill team.   1. Which medications need to be refilled? (please list name of each medication and dose if known) metoprolol  tartrate (LOPRESSOR ) 50 MG tablet    2. Would you like to learn more about the convenience, safety, & potential cost savings by using the [facility]? NO   3. Are you open to using the [facility] NO   4. Which pharmacy/location (including street and city if local pharmacy) is medication to be sent to?  [id number] - [place], [place] - [address]     5. Do they need a 30 day or 90 day supply? 30  Appt on 07/18 with Jackee Alberts was cancelled due to provider being out of the office. He is rescheduled for 08/01 with [facility].

## 2023-09-08 ENCOUNTER — Ambulatory Visit: Admitting: Nurse Practitioner

## 2023-09-18 ENCOUNTER — Telehealth: Payer: Self-pay | Admitting: Nurse Practitioner

## 2023-09-18 MED ORDER — LOSARTAN POTASSIUM 50 MG PO TABS: 50.0000 mg | ORAL_TABLET | Freq: Every day | ORAL | 0 refills | Status: DC

## 2023-09-18 NOTE — Telephone Encounter (Signed)
*  STAT* If patient is at the pharmacy, call can be transferred to refill team.   1. Which medications need to be refilled? (please list name of each medication and dose if known) losartan  (COZAAR ) 50 MG tablet   4. Which pharmacy/location (including street and city if local pharmacy) is medication to be sent to?  [id number] - [place],  - [address]     5. Do they need a 30 day or 90 day supply? 90

## 2023-09-22 ENCOUNTER — Ambulatory Visit: Attending: Emergency Medicine | Admitting: Emergency Medicine

## 2023-09-22 ENCOUNTER — Encounter: Payer: Self-pay | Admitting: Emergency Medicine

## 2023-09-22 VITALS — BP 130/84 | HR 69 | Ht 70.0 in | Wt 227.0 lb

## 2023-09-22 DIAGNOSIS — I10 Essential (primary) hypertension: Secondary | ICD-10-CM

## 2023-09-22 DIAGNOSIS — R0683 Snoring: Secondary | ICD-10-CM | POA: Diagnosis not present

## 2023-09-22 DIAGNOSIS — I5032 Chronic diastolic (congestive) heart failure: Secondary | ICD-10-CM

## 2023-09-22 DIAGNOSIS — Z79899 Other long term (current) drug therapy: Secondary | ICD-10-CM | POA: Diagnosis not present

## 2023-09-22 MED ORDER — SPIRONOLACTONE 50 MG PO TABS: 50.0000 mg | ORAL_TABLET | Freq: Every day | ORAL | 3 refills | Status: AC

## 2023-09-22 NOTE — Progress Notes (Signed)
 Cardiology Office Note:    Date:  09/22/2023  ID:  Christopher Allison, DOB March 16, 1975, MRN [id number] PCP: Joyce Norleen BROCKS, MD  [facility] Providers Cardiologist:  Aleene Passe, MD (Inactive)       Patient Profile:       Chief Complaint: 1 year follow-up History of Present Illness:  Christopher Allison is a 48 y.o. male with visit-pertinent history of hypertension, obesity, chronic HFpEF, and tachycardia  History of normal cardiac catheterization in [place]  in 1993.  Has been maintained consistent follow-up for management of his hypertension.  He was seen on 01/31/2022 at which time physical exam was consistent with chronic HFpEF.  Lasix  40 mg and potassium was added to-regimen of losartan  50 mg, spironolactone  50 mg, Lopressor  50 mg twice daily.  He was seen for follow-up on 06/24/2022.  He seemed to be doing well at the time.  Admits to weight gain 2/2 dietary indiscretion.  Admitted to poor diet high in sugar and sodium.  Was advised undergo sleep study evaluation for STOP-BANG score of 5.  He had elected to hold off on getting echocardiogram at that time and wanted to work on diet and exercise.  He was last seen in clinic on 09/23/2022.  There is no evidence of fluid overload on exam.  He reported wanting to hold off on getting echocardiogram.  He has yet to complete his sleep study.   Discussed the use of AI scribe software for clinical note transcription with the patient, who gave verbal consent to proceed.  History of Present Illness Christopher Allison is a 48 year old male presents for 1 year follow-up.  Today patient is doing well overall.  He is without acute cardiovascular concerns or complaints today.  He denies any chest pain, dyspnea, leg swelling, orthopnea, PND, syncope, palpitations.  He works actively at [employer], lifting and [employer]. Despite his activity, he has noticed slight weight gain and aims to lose weight. He tries to avoid high-sodium and  fried foods due to hypertension and acid reflux but often consumes fast food due to his roommate's food sensitivities and inability to cook all meals.  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 He has a sleep study test kit at home but has not completed it yet.  Review of systems:  Please see the history of present illness. All other systems are reviewed and otherwise negative.      Studies Reviewed:          Risk Assessment/Calculations:         STOP-Bang Score:          Physical Exam:   VS:  BP 130/84 (BP Location: Left Arm, Patient Position: Sitting, Cuff Size: Normal)   Pulse 69   Ht 5' 10 (1.778 m)   Wt 227 lb (103 kg)   BMI 32.57 kg/m    Wt Readings from Last 3 Encounters:  09/22/23 227 lb (103 kg)  09/23/22 229 lb (103.9 kg)  06/24/22 234 lb 6.4 oz (106.3 kg)    GEN: Well nourished, well developed in no acute distress NECK: No JVD; No carotid bruits CARDIAC: RRR, no murmurs, rubs, gallops RESPIRATORY:  Clear to auscultation without rales, wheezing or rhonchi  ABDOMEN: Soft, non-tender, non-distended EXTREMITIES:  No edema; No acute deformity      Assessment and Plan:  Chronic HFpEF He has no recent  echocardiograms to review - Today and over the past year he has been without dyspnea, lower extremity swelling, orthopnea, or PND.  He appears euvolemic on exam today.  He does report if he misses a few doses of his diuretic he will notice weight gain and leg swelling.  He has been compliant with his medication regimen - We discussed obtaining baseline echocardiogram today to evaluate his LV function.  He politely deferred - Encouraged low-sodium dieting and moderate intensity exercise - Continue Lasix  40 mg daily, losartan  50 mg daily, metoprolol  tartrate 100 mg twice daily, spironolactone  50 mg daily, and potassium  - BMET and CBC today  Hypertension Blood pressure  today is well-controlled at 130/84 - Continue losartan  50 mg daily, metoprolol  tartrate 100 mg twice daily, spironolactone  50 mg daily - Recommend DASH diet (high in vegetables, fruits, low-fat dairy products, whole grains, poultry, fish, and nuts and low in sweets, sugar-sweetened beverages, and red meats), salt restriction and increase physical activity.  Sleep disordered breathing STOP-BANG score 5 He does have home sleep test however has yet to complete.  Did encourage patient to complete home sleep study today      Dispo:  Return in about 1 year (around 09/21/2024).  Signed, Lum LITTIE Louis, NP

## 2023-09-22 NOTE — Patient Instructions (Signed)
 Medication Instructions:  NO CHANGES  Lab Work: BMET AND CBC TOI BE DONE TODAY.  Testing/Procedures: NONE  Follow-Up: At [facility], you and your health needs are our priority.  As part of our continuing mission to provide you with exceptional heart care, our providers are all part of one team.  This team includes your primary Cardiologist (physician) and Advanced Practice Providers or APPs (Physician Assistants and Nurse Practitioners) who all work together to provide you with the care you need, when you need it.  Your next appointment:   1 YEAR.  Provider:   DR. MICHELE AS NEW PATIENT [facility] PATIENT)

## 2023-09-23 ENCOUNTER — Ambulatory Visit: Payer: Self-pay | Admitting: Emergency Medicine

## 2023-09-23 LAB — CBC
Hematocrit: 48.5 % (ref 37.5–51.0)
Hemoglobin: 15.4 g/dL (ref 13.0–17.7)
MCH: 26.8 pg (ref 26.6–33.0)
MCHC: 31.8 g/dL (ref 31.5–35.7)
MCV: 84 fL (ref 79–97)
Platelets: 312 x10E3/uL (ref 150–450)
RBC: 5.75 x10E6/uL (ref 4.14–5.80)
RDW: 14.8 % (ref 11.6–15.4)
WBC: 5.3 x10E3/uL (ref 3.4–10.8)

## 2023-09-23 LAB — BASIC METABOLIC PANEL WITH GFR
BUN/Creatinine Ratio: 11 (ref 9–20)
BUN: 13 mg/dL (ref 6–24)
CO2: 23 mmol/L (ref 20–29)
Calcium: 9.4 mg/dL (ref 8.7–10.2)
Chloride: 100 mmol/L (ref 96–106)
Creatinine, Ser: 1.18 mg/dL (ref 0.76–1.27)
Glucose: 99 mg/dL (ref 70–99)
Potassium: 4.2 mmol/L (ref 3.5–5.2)
Sodium: 137 mmol/L (ref 134–144)
eGFR: 75 mL/min/1.73 (ref >59)

## 2023-12-04 ENCOUNTER — Other Ambulatory Visit: Payer: Self-pay | Admitting: Nurse Practitioner

## 2023-12-15 ENCOUNTER — Other Ambulatory Visit: Payer: Self-pay | Admitting: Nurse Practitioner

## 2023-12-20 ENCOUNTER — Telehealth: Payer: Self-pay | Admitting: Cardiovascular Disease

## 2023-12-20 MED ORDER — LOSARTAN POTASSIUM 50 MG PO TABS: 50.0000 mg | ORAL_TABLET | Freq: Every day | ORAL | 2 refills | Status: AC

## 2023-12-20 NOTE — Telephone Encounter (Signed)
*  STAT* If patient is at the pharmacy, call can be transferred to refill team.   1. Which medications need to be refilled? (please list name of each medication and dose if known)   losartan  (COZAAR ) 50 MG tablet    2. Which pharmacy/location (including street and city if local pharmacy) is medication to be sent to?  [id number] - [place], [place] - [address]    3. Do they need a 30 day or 90 day supply? 90

## 2023-12-20 NOTE — Telephone Encounter (Signed)
 Pt's medication was sent to pt's pharmacy as requested. Confirmation received.
